# Patient Record
Sex: Female | Born: 1935 | Race: White | Hispanic: No | Smoking: Former smoker
Health system: Southern US, Community
[De-identification: ages and names within clinical notes are randomized; demographics above are authoritative.]

## PROBLEM LIST (undated history)

## (undated) DIAGNOSIS — Z789 Other specified health status: Secondary | ICD-10-CM

## (undated) DIAGNOSIS — I4891 Unspecified atrial fibrillation: Secondary | ICD-10-CM

## (undated) DIAGNOSIS — E782 Mixed hyperlipidemia: Secondary | ICD-10-CM

## (undated) DIAGNOSIS — F0390 Unspecified dementia without behavioral disturbance: Secondary | ICD-10-CM

## (undated) DIAGNOSIS — R32 Unspecified urinary incontinence: Secondary | ICD-10-CM

## (undated) DIAGNOSIS — R1013 Epigastric pain: Secondary | ICD-10-CM

## (undated) DIAGNOSIS — I10 Essential (primary) hypertension: Secondary | ICD-10-CM

## (undated) DIAGNOSIS — E119 Type 2 diabetes mellitus without complications: Secondary | ICD-10-CM

## (undated) DIAGNOSIS — E785 Hyperlipidemia, unspecified: Secondary | ICD-10-CM

## (undated) HISTORY — DX: Hyperlipidemia, unspecified: E78.5

## (undated) HISTORY — DX: Epigastric pain: R10.13

## (undated) HISTORY — DX: Mixed hyperlipidemia: E78.2

## (undated) HISTORY — DX: Other specified health status: Z78.9

## (undated) HISTORY — DX: Essential (primary) hypertension: I10

## (undated) HISTORY — DX: Unspecified atrial fibrillation: I48.91

## (undated) HISTORY — PX: ABDOMINAL HYSTERECTOMY: SHX81

## (undated) HISTORY — PX: CHOLECYSTECTOMY: SHX55

## (undated) HISTORY — DX: Unspecified dementia, unspecified severity, without behavioral disturbance, psychotic disturbance, mood disturbance, and anxiety: F03.90

## (undated) HISTORY — DX: Unspecified urinary incontinence: R32

## (undated) HISTORY — DX: Type 2 diabetes mellitus without complications: E11.9

---

## 1978-09-15 HISTORY — PX: OTHER SURGICAL HISTORY: SHX169

## 2005-09-15 HISTORY — PX: BACK SURGERY: SHX140

## 2010-10-21 DIAGNOSIS — M549 Dorsalgia, unspecified: Secondary | ICD-10-CM

## 2010-10-21 DIAGNOSIS — K635 Polyp of colon: Secondary | ICD-10-CM | POA: Insufficient documentation

## 2010-10-21 DIAGNOSIS — Z9049 Acquired absence of other specified parts of digestive tract: Secondary | ICD-10-CM

## 2010-10-21 DIAGNOSIS — I119 Hypertensive heart disease without heart failure: Secondary | ICD-10-CM | POA: Insufficient documentation

## 2010-10-21 DIAGNOSIS — G8929 Other chronic pain: Secondary | ICD-10-CM

## 2010-10-21 DIAGNOSIS — E782 Mixed hyperlipidemia: Secondary | ICD-10-CM | POA: Insufficient documentation

## 2010-10-21 DIAGNOSIS — J309 Allergic rhinitis, unspecified: Secondary | ICD-10-CM

## 2010-10-21 DIAGNOSIS — I10 Essential (primary) hypertension: Secondary | ICD-10-CM | POA: Insufficient documentation

## 2010-10-21 HISTORY — DX: Other chronic pain: G89.29

## 2010-10-21 HISTORY — DX: Dorsalgia, unspecified: M54.9

## 2010-10-21 HISTORY — DX: Allergic rhinitis, unspecified: J30.9

## 2010-10-21 HISTORY — DX: Acquired absence of other specified parts of digestive tract: Z90.49

## 2013-05-20 DIAGNOSIS — I440 Atrioventricular block, first degree: Secondary | ICD-10-CM | POA: Insufficient documentation

## 2013-12-30 DIAGNOSIS — R195 Other fecal abnormalities: Secondary | ICD-10-CM | POA: Insufficient documentation

## 2014-06-12 DIAGNOSIS — H269 Unspecified cataract: Secondary | ICD-10-CM | POA: Insufficient documentation

## 2014-06-26 HISTORY — PX: CATARACT EXTRACTION: SUR2

## 2014-12-22 DIAGNOSIS — M19041 Primary osteoarthritis, right hand: Secondary | ICD-10-CM | POA: Insufficient documentation

## 2015-11-11 DIAGNOSIS — R079 Chest pain, unspecified: Secondary | ICD-10-CM | POA: Insufficient documentation

## 2015-11-11 DIAGNOSIS — R002 Palpitations: Secondary | ICD-10-CM | POA: Insufficient documentation

## 2017-04-06 DIAGNOSIS — R7303 Prediabetes: Secondary | ICD-10-CM | POA: Insufficient documentation

## 2017-10-07 DIAGNOSIS — Z0000 Encounter for general adult medical examination without abnormal findings: Secondary | ICD-10-CM | POA: Insufficient documentation

## 2017-11-20 DIAGNOSIS — M810 Age-related osteoporosis without current pathological fracture: Secondary | ICD-10-CM | POA: Insufficient documentation

## 2018-06-01 DIAGNOSIS — F0390 Unspecified dementia without behavioral disturbance: Secondary | ICD-10-CM | POA: Insufficient documentation

## 2018-10-04 LAB — CBC AND DIFFERENTIAL
HCT: 45 (ref 36–46)
Hemoglobin: 14.4 (ref 12.0–16.0)
Platelets: 229 (ref 150–399)
WBC: 8.5

## 2018-10-04 LAB — COMPREHENSIVE METABOLIC PANEL
Albumin: 4.7 (ref 3.5–5.0)
Calcium: 9.5 (ref 8.7–10.7)

## 2018-10-04 LAB — BASIC METABOLIC PANEL
BUN: 17 (ref 4–21)
Creatinine: 0.7 (ref 0.5–1.1)
Glucose: 112
Potassium: 3.7 (ref 3.4–5.3)
Sodium: 145 (ref 137–147)

## 2018-10-04 LAB — LIPID PANEL
Cholesterol: 138 (ref 0–200)
HDL: 40 (ref 35–70)
LDL Cholesterol: 74
Triglycerides: 83 (ref 40–160)

## 2018-10-04 LAB — TSH: TSH: 1.84 (ref 0.41–5.90)

## 2018-10-04 LAB — HEMOGLOBIN A1C: Hemoglobin A1C: 5.9

## 2018-10-11 DIAGNOSIS — M545 Low back pain, unspecified: Secondary | ICD-10-CM | POA: Insufficient documentation

## 2018-10-12 DIAGNOSIS — I4891 Unspecified atrial fibrillation: Secondary | ICD-10-CM | POA: Insufficient documentation

## 2018-10-21 DIAGNOSIS — K635 Polyp of colon: Secondary | ICD-10-CM

## 2018-10-21 HISTORY — DX: Polyp of colon: K63.5

## 2018-10-27 LAB — HM MAMMOGRAPHY: HM Mammogram: NORMAL (ref 0–4)

## 2018-11-09 DIAGNOSIS — R931 Abnormal findings on diagnostic imaging of heart and coronary circulation: Secondary | ICD-10-CM

## 2018-11-09 HISTORY — DX: Abnormal findings on diagnostic imaging of heart and coronary circulation: R93.1

## 2019-04-11 DIAGNOSIS — R32 Unspecified urinary incontinence: Secondary | ICD-10-CM | POA: Insufficient documentation

## 2019-04-11 DIAGNOSIS — R252 Cramp and spasm: Secondary | ICD-10-CM | POA: Insufficient documentation

## 2019-11-03 ENCOUNTER — Ambulatory Visit: Payer: Self-pay | Admitting: Internal Medicine

## 2019-11-04 ENCOUNTER — Encounter: Payer: Self-pay | Admitting: Internal Medicine

## 2019-12-05 ENCOUNTER — Ambulatory Visit: Payer: Self-pay | Admitting: Internal Medicine

## 2019-12-08 ENCOUNTER — Ambulatory Visit: Payer: Self-pay | Admitting: Internal Medicine

## 2019-12-09 DIAGNOSIS — I609 Nontraumatic subarachnoid hemorrhage, unspecified: Secondary | ICD-10-CM | POA: Insufficient documentation

## 2019-12-13 MED ORDER — GENERIC EXTERNAL MEDICATION
Status: DC
Start: ? — End: 2019-12-13

## 2019-12-13 MED ORDER — SALINE BACTERIOSTATIC 0.9 % IJ SOLN
3.00 | INTRAMUSCULAR | Status: DC
Start: ? — End: 2019-12-13

## 2019-12-13 MED ORDER — METHOCARBAMOL 750 MG PO TABS
750.00 | ORAL_TABLET | ORAL | Status: DC
Start: 2019-12-13 — End: 2019-12-13

## 2019-12-13 MED ORDER — COENZYME Q10 10 MG PO CAPS
10.00 | ORAL_CAPSULE | ORAL | Status: DC
Start: 2019-12-13 — End: 2019-12-13

## 2019-12-13 MED ORDER — TRAMADOL HCL 50 MG PO TABS
50.00 | ORAL_TABLET | ORAL | Status: DC
Start: ? — End: 2019-12-13

## 2019-12-13 MED ORDER — TOLTERODINE TARTRATE 2 MG PO TABS
2.00 | ORAL_TABLET | ORAL | Status: DC
Start: 2019-12-13 — End: 2019-12-13

## 2019-12-13 MED ORDER — VALSARTAN 80 MG PO TABS
80.00 | ORAL_TABLET | ORAL | Status: DC
Start: 2019-12-13 — End: 2019-12-13

## 2019-12-13 MED ORDER — QUETIAPINE FUMARATE 25 MG PO TABS
25.00 | ORAL_TABLET | ORAL | Status: DC
Start: 2019-12-13 — End: 2019-12-13

## 2019-12-13 MED ORDER — LEVETIRACETAM 500 MG PO TABS
500.00 | ORAL_TABLET | ORAL | Status: DC
Start: 2019-12-13 — End: 2019-12-13

## 2019-12-13 MED ORDER — MEMANTINE HCL 5 MG PO TABS
5.00 | ORAL_TABLET | ORAL | Status: DC
Start: 2019-12-13 — End: 2019-12-13

## 2019-12-13 MED ORDER — CYANOCOBALAMIN 1000 MCG PO TABS
1000.00 | ORAL_TABLET | ORAL | Status: DC
Start: 2019-12-13 — End: 2019-12-13

## 2019-12-13 MED ORDER — ENOXAPARIN SODIUM 30 MG/0.3ML ~~LOC~~ SOLN
30.00 | SUBCUTANEOUS | Status: DC
Start: 2019-12-13 — End: 2019-12-13

## 2019-12-13 MED ORDER — SODIUM CHLORIDE 0.9 % IV SOLN
42.00 | INTRAVENOUS | Status: DC
Start: ? — End: 2019-12-13

## 2019-12-13 MED ORDER — ATORVASTATIN CALCIUM 80 MG PO TABS
80.00 | ORAL_TABLET | ORAL | Status: DC
Start: 2019-12-13 — End: 2019-12-13

## 2019-12-13 MED ORDER — ACETAMINOPHEN 325 MG PO TABS
650.00 | ORAL_TABLET | ORAL | Status: DC
Start: 2019-12-12 — End: 2019-12-13

## 2019-12-13 MED ORDER — DONEPEZIL HCL 5 MG PO TABS
10.00 | ORAL_TABLET | ORAL | Status: DC
Start: 2019-12-13 — End: 2019-12-13

## 2019-12-22 ENCOUNTER — Encounter (INDEPENDENT_AMBULATORY_CARE_PROVIDER_SITE_OTHER): Payer: Self-pay

## 2019-12-22 ENCOUNTER — Encounter: Payer: Self-pay | Admitting: Internal Medicine

## 2019-12-22 ENCOUNTER — Ambulatory Visit (INDEPENDENT_AMBULATORY_CARE_PROVIDER_SITE_OTHER): Payer: Medicare Other | Admitting: Internal Medicine

## 2019-12-22 ENCOUNTER — Other Ambulatory Visit: Payer: Self-pay

## 2019-12-22 VITALS — BP 132/86 | HR 117 | Temp 97.8°F | Ht 63.0 in | Wt 183.6 lb

## 2019-12-22 DIAGNOSIS — W19XXXA Unspecified fall, initial encounter: Secondary | ICD-10-CM | POA: Insufficient documentation

## 2019-12-22 DIAGNOSIS — G301 Alzheimer's disease with late onset: Secondary | ICD-10-CM | POA: Diagnosis not present

## 2019-12-22 DIAGNOSIS — I609 Nontraumatic subarachnoid hemorrhage, unspecified: Secondary | ICD-10-CM | POA: Diagnosis not present

## 2019-12-22 DIAGNOSIS — M810 Age-related osteoporosis without current pathological fracture: Secondary | ICD-10-CM

## 2019-12-22 DIAGNOSIS — Z7189 Other specified counseling: Secondary | ICD-10-CM

## 2019-12-22 DIAGNOSIS — F02818 Dementia in other diseases classified elsewhere, unspecified severity, with other behavioral disturbance: Secondary | ICD-10-CM

## 2019-12-22 DIAGNOSIS — F0281 Dementia in other diseases classified elsewhere with behavioral disturbance: Secondary | ICD-10-CM

## 2019-12-22 DIAGNOSIS — R3981 Functional urinary incontinence: Secondary | ICD-10-CM

## 2019-12-22 DIAGNOSIS — I4821 Permanent atrial fibrillation: Secondary | ICD-10-CM

## 2019-12-22 DIAGNOSIS — M1712 Unilateral primary osteoarthritis, left knee: Secondary | ICD-10-CM

## 2019-12-22 MED ORDER — APIXABAN 5 MG PO TABS: 5.0000 mg | ORAL_TABLET | Freq: Two times a day (BID) | ORAL | 3 refills | Status: DC

## 2019-12-22 MED ORDER — DONEPEZIL HCL 10 MG PO TABS: 10.0000 mg | ORAL_TABLET | Freq: Every evening | ORAL | 1 refills | Status: DC

## 2019-12-22 NOTE — Patient Instructions (Addendum)
Make sure pulse is staying less than 100.  Check twice a day.   Call me if it's staying high.    We'll check your urine for infection.  We will also set you up for prolia here.

## 2019-12-22 NOTE — Progress Notes (Signed)
Provider:  Gwenith Spitz. Renato Gails, D.O., C.M.D. Location:   [facility]   Place of Service:   clinic  Previous PCP: Stacey Balo, DO Patient Care Team: Stacey Balo, DO as PCP - General (Geriatric Medicine)  Extended Emergency Contact Information Primary Emergency Contact: Stacey Dalton Mobile Phone: [phone] Relation: Daughter  Code Status: not yet in place, was discussed some today and family is working on advance care planning discussions Goals of Care: Advanced Directive information Advanced Directives 12/22/2019  Does Patient Have a Medical Advance Directive? No  Does patient want to make changes to medical advance directive? No - Patient declined  Would patient like information on creating a medical advance directive? Yes (ED - Information included in AVS)   Chief Complaint  Patient presents with  . Establish Care    New patient to establish care     HPI: Patient is a 85 y.o. female with h/o dementia, afib, DMII, hyperlipidemia, htn, incontinence, melanoma on her left side of her face, spinal stenosis s/p surgery, and cataracts s/p surgery seen today to establish with [facility].  Records have been requested from Dr. Burt Knack at [facility] in [place], [place].  Here since end of Dec with her daughter, Stacey Dalton.  They visited her son for his bday in [place].  She lived in [place] prior.  Her husband passed away in 08-20-2023.  Son in [place] took care of her until she came here.    In [place], their son had helped as caregiver for them both (dad physically and mom for dementia).  He (son) had melanoma and passed away in 07-20-23.  At that time, she was needing help to make sure she showered and to get meals.  Her daughter stays with her and prompts her with showering and dressing.  She had trouble turning off water.  Uses the handheld.  She's been generally healthy prior to this. She may see people outside --looks for them continuously.    She's had some  paranoia.   She takes seroquel at night.  Some of the paranoia does happen in the daytime.    They have a caregiver who was three days per week and next week will be 5 days per week.  Her daughter is returning to work at the bank.  They were referred by [facility].  She does announce she wants to leave the house and trying to.  No need yet for locks per Stacey Dalton.  She helps with cooking some but not with hot stuff.  She sleeps well at night.  Wakes up at 7am now and then more anxious later in the day.  They've been encouraging a nap.  Used to sleep later to 8:30-9.    Fo her afib, she'd been on eliquis prior to fall 3/26.    She sustained a right frontal subarachnoid hemorrhage on 3/26 after ground level fall while with son in [place].  Also had left periorbital hematoma.   She is now back in [place] with her daughter.    She gets up to urinate sometimes early am and goes back to bed.  Soaks the bed instead much of the time.  Takes detrol bid.  Goes lots.  Depends gets changed 5-6x per day.  No dysuria.  Occasionally hurts over lower abdomen.  2/22, was having frequency and urgency.  Went to [place] and had urine tested which was negative.    C/o knee pain.  Left one hurts her.  Has arthritis.  Did have a little rash on her bottom after her hospitalization but that's better.  She' on B12 supplement--unclear if level was low.    Does take calcium with D.   She has gotten prolia injections for her osteoporosis.  Last was late Nov.  We need to get this approved here locally for her. 10/07/17 was last bone density. She fell once 2 years ago with an unlevel sidewalk with [employer] representative by her church.  Skinned her knee.   Does not use assistive device.    She had prior melanoma on her left face that was removed surgically, but had not metastasized.    We discussed mammograms and they agree with stopping screening for breast cancer at her advanced age b/c she would not tolerate treatments  at this point.   Had both covid vaccines.   Past Medical History:  Diagnosis Date  . A-fib (HCC)   . Allergic rhinitis 10/21/2010  . Back pain 10/21/2010  . Chronic back pain 10/21/2010  . Colonic polyp 10/21/2018  . Dementia (HCC)   . Diabetes mellitus without complication (HCC)   . Echocardiogram abnormal 11/09/2018  . Epigastric abdominal pain   . History of recent travel   . Hyperlipidemia   . Hypertension   . Incontinence   . Mixed hyperlipidemia   . S/P cholecystectomy 10/21/2010   Past Surgical History:  Procedure Laterality Date  . ABDOMINAL HYSTERECTOMY     partial due to prolapse  . BACK SURGERY  2007  . CATARACT EXTRACTION Left 06/26/2014  . CHOLECYSTECTOMY    . melanoma resection Right 1980    Social History   Socioeconomic History  . Marital status: Widowed    Spouse name: Not on file  . Number of children: 5  . Years of education: Not on file  . Highest education level: Not on file  Occupational History  . Not on file  Tobacco Use  . Smoking status: Former Games developer  . Smokeless tobacco: Never Used  Substance and Sexual Activity  . Alcohol use: Yes    Alcohol/week: 2.0 - 3.0 standard drinks    Types: 2 - 3 Glasses of wine per week  . Drug use: Never  . Sexual activity: Not Currently  Other Topics Concern  . Not on file  Social History Narrative   Diet: Standard -Minimize   Sugar (Carbs)        Do you drink/ eat things with caffeine?  Yes Caffeinated Hot Tea      Marital status:     Widow                          What year were you married ? 1958      Do you live in a house, apartment,assistred living, condo, trailer, etc.)?  House      Is it one or more stories? 1 Story [facility] many persons live in your home ?  2      Do you have any pets in your home ?(please list)   No      Highest Level of education completed:  Junior College       Current or past profession: Homemaker      Do you exercise?     No                          Type & how often  ADVANCED DIRECTIVES (Please bring copies)      Do you have a living will? No      Do you have a DNR form?   No                    If not, do you want to discuss one?       Do you have signed POA?HPOA forms?   No              If so, please bring to your appointment      FUNCTIONAL STATUS- To be completed by Spouse / child / Staff  (Child)      Do you have difficulty bathing or dressing yourself ?  Yes      Do you have difficulty preparing food or eating ?  Yes      Do you have difficulty managing your mediation ?  Yes      Do you have difficulty managing your finances ?  Yes      Do you have difficulty affording your medication ?  No      Social Determinants of Health   Financial Resource Strain:   . Difficulty of Paying Living Expenses:   Food Insecurity:   . Worried About Programme researcher, broadcasting/film/video in the Last Year:   . Barista in the Last Year:   Transportation Needs:   . Freight forwarder (Medical):   Marland Kitchen Lack of Transportation (Non-Medical):   Physical Activity:   . Days of Exercise per Week:   . Minutes of Exercise per Session:   Stress:   . Feeling of Stress :   Social Connections:   . Frequency of Communication with Friends and Family:   . Frequency of Social Gatherings with Friends and Family:   . Attends Religious Services:   . Active Member of Clubs or Organizations:   . Attends Banker Meetings:   Marland Kitchen Marital Status:     reports that she has quit smoking. She has never used smokeless tobacco. She reports current alcohol use of about 2.0 - 3.0 standard drinks of alcohol per week. She reports that she does not use drugs.  Functional Status Survey:  see above hpi  Family History  Problem Relation Age of Onset  . Cancer Mother   . Dementia Father        Natural Death  . Hodgkin's lymphoma Son   . Melanoma Son        Seizures    Health Maintenance  Topic Date Due  . DEXA SCAN  12/21/2020 (Originally  06/15/2002)  . INFLUENZA VACCINE  04/15/2020  . TETANUS/TDAP  12/08/2029  . PNA vac Low Risk Adult  Completed    Allergies  Allergen Reactions  . Chamomile Flowers [Chamomile]   . Lisinopril   . Mixed Ragweed   . Other     Seasonal allergies, cats    Outpatient Encounter Medications as of 12/22/2019  Medication Sig  . Calcium Citrate-Vitamin D 315-250 MG-UNIT TABS Take 2 capsules by mouth daily.  . Coenzyme Q10 10 MG capsule Take 100 mg by mouth daily.  . cyanocobalamin 1000 MCG tablet Take 1,000 mcg by mouth every evening.  . donepezil (ARICEPT) 10 MG tablet Take 1 tablet (10 mg total) by mouth every evening.  Marland Kitchen losartan (COZAAR) 50 MG tablet Take 50 mg by mouth daily.  . memantine (NAMENDA) 10 MG tablet Take 5 mg by mouth 2 (two) times daily.  Marland Kitchen  QUEtiapine (SEROQUEL) 25 MG tablet Take 25 mg by mouth every evening.  . rosuvastatin (CRESTOR) 20 MG tablet Take 20 mg by mouth daily.  . [DISCONTINUED] donepezil (ARICEPT) 10 MG tablet Take 10 mg by mouth every evening.  . [DISCONTINUED] tolterodine (DETROL) 2 MG tablet Take 2 mg by mouth 2 (two) times daily.  Marland Kitchen apixaban (ELIQUIS) 5 MG TABS tablet Take 1 tablet (5 mg total) by mouth 2 (two) times daily.  . [DISCONTINUED] apixaban (ELIQUIS) 5 MG TABS tablet Take 5 mg by mouth 2 (two) times daily.   No facility-administered encounter medications on file as of 12/22/2019.    Review of Systems  Constitutional: Positive for malaise/fatigue. Negative for chills and fever.       Weight gain  HENT:       Constant drainage  Eyes: Negative for blurred vision.  Respiratory: Negative for cough and shortness of breath.   Cardiovascular: Negative for chest pain, palpitations and leg swelling.       Afib  Gastrointestinal: Positive for abdominal pain. Negative for blood in stool, constipation, diarrhea, heartburn, melena, nausea and vomiting.  Genitourinary:       Urinary incontinence, frequency, urgency, sometimes lower abdominal pain    Musculoskeletal: Positive for falls and joint pain.       Osteoporosis on prolia   Skin:       Dry skin, thickened toenails--sees podiatry  Endo/Heme/Allergies: Positive for environmental allergies. Bruises/bleeds easily.       Seasonal; prediabetes  Psychiatric/Behavioral: Positive for memory loss. Negative for depression. The patient is not nervous/anxious and does not have insomnia.        Paranoia and some visual hallucinations of people outside    Vitals:   12/22/19 1331  BP: 132/86  Pulse: (!) 117  Temp: 97.8 F (36.6 C)  TempSrc: Temporal  SpO2: 97%  Weight: 183 lb 9.6 oz (83.3 kg)  Height: 5\' 3"  (1.6 m)   Body mass index is 32.52 kg/m. Physical Exam  Labs reviewed: Basic Metabolic Panel: No results for input(s): NA, K, CL, CO2, GLUCOSE, BUN, CREATININE, CALCIUM, MG, PHOS in the last 8760 hours. Liver Function Tests: No results for input(s): AST, ALT, ALKPHOS, BILITOT, PROT, ALBUMIN in the last 8760 hours. No results for input(s): LIPASE, AMYLASE in the last 8760 hours. No results for input(s): AMMONIA in the last 8760 hours. CBC: No results for input(s): WBC, NEUTROABS, HGB, HCT, MCV, PLT in the last 8760 hours. Cardiac Enzymes: No results for input(s): CKTOTAL, CKMB, CKMBINDEX, TROPONINI in the last 8760 hours. BNP: Invalid input(s): POCBNP Lab Results  Component Value Date   HGBA1C 5.9 10/04/2018   Lab Results  Component Value Date   TSH 1.84 10/04/2018   No results found for: VITAMINB12 No results found for: FOLATE No results found for: IRON, TIBC, FERRITIN    Assessment/Plan 1. SAH (subarachnoid hemorrhage) ([place]) -with recent fall -appears she has had no long-term effects from this -it was shrinking on repeat imaging -discussed risk vs benefit re: NOAC resumption for afib (CHADS2vasc and HASBLED) and determined that stroke risk outweighed bleeding risk when she typically does not have falls at baseline  2. Late onset Alzheimer's disease with  behavioral disturbance (Standard) - will assess MMSE next visit in 6 wks -cont support from her daughter and caregivers at home -not to be home alone - donepezil (ARICEPT) 10 MG tablet; Take 1 tablet (10 mg total) by mouth every evening.  Dispense: 90 tablet; Refill: 1 -takes seroquel for her paranoia (?  Degree of benefit vs risk of stroke events and mortality per black box warning--will review more at next visit)  3. Permanent atrial fibrillation (HCC) - rate control did improve to normal range by end of visit (suspect anxiety in new location) - we opted (her daughter and myself) to use NOAC for anticoagulation and stroke prevention - apixaban (ELIQUIS) 5 MG TABS tablet; Take 1 tablet (5 mg total) by mouth 2 (two) times daily.  Dispense: 180 tablet; Refill: 3 -has normal creatinine and bmi is 32 so only dose reduction factor is age in her case  4. Functional urinary incontinence -suspect is cause of her symptoms; however, this is her first visit here so we will r/o infection due to some lower abdominal discomfort also - Urinalysis, Routine w reflex microscopic - Urine Culture  5. Age-related osteoporosis without current pathological fracture -will ask referral coordinator to get prolia approved here since pt was taking in [place] (see hpi) and had prior bone density there  6. Localized osteoarthritis of left knee -has been ongoing and did have some increase in discomfort with her fall -discussed tylenol, topicals as safest remedies, potentially injections if these are not effective  7. ACP (advance care planning) -encouraged completion of updated living will, HCPOA documents and discussed purpose of DNR and MOST orders, as well -pt and daughter to discuss more at home and we will readdress -16 mins spent initiating discussion  Labs/tests ordered:   Lab Orders     Urine Culture     MICROSCOPIC MESSAGE     Urinalysis, Routine w reflex microscopic  02/06/2020 6 wk f/u for MMSE,  ACP  Jane Broughton L. Linn Clavin, D.O. Geriatrics [facility] [address] [place], [place] [address] Cell Phone (Mon-Fri 8am-5pm):  [phone] On Call:  [phone] & follow prompts after 5pm & weekends Office Phone:  [phone] Office Fax:  [phone]

## 2019-12-23 LAB — URINALYSIS, ROUTINE W REFLEX MICROSCOPIC
Bilirubin Urine: NEGATIVE
Glucose, UA: NEGATIVE
Hgb urine dipstick: NEGATIVE
Hyaline Cast: NONE SEEN /LPF
Ketones, ur: NEGATIVE
Nitrite: NEGATIVE
Protein, ur: NEGATIVE
Specific Gravity, Urine: 1.021 (ref 1.001–1.03)
pH: 6.5 (ref 5.0–8.0)

## 2019-12-23 LAB — URINE CULTURE
MICRO NUMBER:: 10343404
SPECIMEN QUALITY:: ADEQUATE

## 2019-12-26 NOTE — Progress Notes (Signed)
Urine sample showed multiple organisms suggesting colonization or that sample was not clean.  I recommend pushing fluids and monitoring.

## 2020-01-18 ENCOUNTER — Encounter: Payer: Self-pay | Admitting: Internal Medicine

## 2020-01-23 ENCOUNTER — Ambulatory Visit (INDEPENDENT_AMBULATORY_CARE_PROVIDER_SITE_OTHER): Payer: Medicare Other | Admitting: Family

## 2020-01-23 ENCOUNTER — Other Ambulatory Visit: Payer: Self-pay

## 2020-01-23 ENCOUNTER — Encounter: Payer: Self-pay | Admitting: Family

## 2020-01-23 VITALS — BP 132/88 | HR 88 | Temp 96.9°F | Resp 18 | Ht 63.0 in | Wt 185.4 lb

## 2020-01-23 DIAGNOSIS — J302 Other seasonal allergic rhinitis: Secondary | ICD-10-CM

## 2020-01-23 DIAGNOSIS — R410 Disorientation, unspecified: Secondary | ICD-10-CM

## 2020-01-23 LAB — POCT URINALYSIS DIPSTICK
Bilirubin, UA: NEGATIVE
Blood, UA: NEGATIVE
Glucose, UA: NEGATIVE
Ketones, UA: NEGATIVE
Nitrite, UA: NEGATIVE
Protein, UA: NEGATIVE
Spec Grav, UA: 1.01 (ref 1.010–1.025)
Urobilinogen, UA: 0.2 E.U./dL
pH, UA: 7 (ref 5.0–8.0)

## 2020-01-23 MED ORDER — LORATADINE 10 MG PO TABS: 10.0000 mg | ORAL_TABLET | Freq: Every day | ORAL | 11 refills | Status: DC

## 2020-01-23 NOTE — Patient Instructions (Signed)
-   Take Loratadine 10 mg tablet one by mouth daily for allergies - Urine specimen collected positive for white blood cells will send for culture and sensitivity will call you with results in 3 days. - Notify provider if symptoms worsen or running any fever > 100.5

## 2020-01-23 NOTE — Progress Notes (Signed)
Provider: Jamyron Redd FNP-C  [name], DO  Patient Care Team: [name], DO as PCP - General (Geriatric Medicine)  Extended Emergency Contact Information Primary Emergency Contact: Audery Amel Mobile Phone: [phone] Relation: Daughter  Code Status: Full Code  Goals of care: Advanced Directive information Advanced Directives 12/22/2019  Does Patient Have a Medical Advance Directive? No  Does patient want to make changes to medical advance directive? No - Patient declined  Would patient like information on creating a medical advance directive? Yes (ED - Information included in AVS)     Chief Complaint  Patient presents with  . Acute Visit    Daughter accompanies patient, is concerned about increased confusion and also increased belching and abdominal pain.     HPI:  Pt is a 85 y.o. female seen today for an acute visit for evaluation of increased confusion x 1 week.She is here with her daughter.she states patient has a care giver.Reports patient tries to dress up despite having clothes on.Tries to get out of the house.she has a significant medical history of dementia which could be contributing to her increased confusion.Care giver encourages her to take a nap during the day. Also states has had belching and abdominal pain.she states abdominal pain is relieved with bowel movement.Appetite is not good but drinks apple juice,and supplement that family gets from [employer]. No issues with constipation.Also denies any hurt burn or chest pain.  Daughter also would  like something for seasonal allergies that will not interact with patient's current medication.she has had runny nose.No cough, fever or chills reported.    Past Medical History:  Diagnosis Date  . A-fib (HCC)   . Allergic rhinitis 10/21/2010  . Back pain 10/21/2010  . Chronic back pain 10/21/2010  . Colonic polyp 10/21/2018  . Dementia (HCC)   . Diabetes mellitus without complication (HCC)   .  Echocardiogram abnormal 11/09/2018  . Epigastric abdominal pain   . History of recent travel   . Hyperlipidemia   . Hypertension   . Incontinence   . Mixed hyperlipidemia   . S/P cholecystectomy 10/21/2010   Past Surgical History:  Procedure Laterality Date  . ABDOMINAL HYSTERECTOMY     partial due to prolapse  . BACK SURGERY  2007  . CATARACT EXTRACTION Left 06/26/2014  . CHOLECYSTECTOMY    . melanoma resection Right 1980    Allergies  Allergen Reactions  . Chamomile Flowers [Chamomile]   . Lisinopril   . Mixed Ragweed   . Other     Seasonal allergies, cats    Outpatient Encounter Medications as of 01/23/2020  Medication Sig  . apixaban (ELIQUIS) 5 MG TABS tablet Take 1 tablet (5 mg total) by mouth 2 (two) times daily.  . Calcium Citrate-Vitamin D 315-250 MG-UNIT TABS Take 2 capsules by mouth daily.  . Coenzyme Q10 100 MG TABS Take 1 tablet by mouth daily.  . cyanocobalamin 1000 MCG tablet Take 1,000 mcg by mouth every evening.  . donepezil (ARICEPT) 10 MG tablet Take 1 tablet (10 mg total) by mouth every evening.  Marland Kitchen losartan (COZAAR) 50 MG tablet Take 50 mg by mouth daily.  . memantine (NAMENDA) 10 MG tablet Take 5 mg by mouth 2 (two) times daily.  . QUEtiapine (SEROQUEL) 25 MG tablet Take 25 mg by mouth every evening.  . rosuvastatin (CRESTOR) 20 MG tablet Take 20 mg by mouth daily.  . [DISCONTINUED] Coenzyme Q10 10 MG capsule Take 100 mg by mouth daily.   No facility-administered encounter  medications on file as of 01/23/2020.    Review of Systems  Constitutional: Negative for chills, fatigue and fever.  HENT: Positive for rhinorrhea. Negative for congestion, postnasal drip, sinus pressure, sinus pain, sneezing, sore throat and trouble swallowing.   Eyes: Negative for discharge, redness and itching.  Respiratory: Negative for cough, chest tightness, shortness of breath and wheezing.   Cardiovascular: Negative for chest pain, palpitations and leg swelling.    Gastrointestinal: Negative for abdominal distention, constipation, diarrhea, nausea and vomiting.       Abd pain per HPI   Genitourinary: Negative for difficulty urinating, dysuria, flank pain, frequency and urgency.  Musculoskeletal: Positive for gait problem.  Skin: Negative for color change, pallor and rash.  Psychiatric/Behavioral: Positive for confusion. Negative for agitation, behavioral problems and sleep disturbance. The patient is not nervous/anxious.     Immunization History  Administered Date(s) Administered  . Influenza, High Dose Seasonal PF 06/28/2012, 08/01/2013, 06/12/2014, 07/23/2015, 07/08/2016, 06/15/2017, 07/12/2019  . Influenza,inj,Quad PF,6+ Mos 06/01/2018  . Influenza-Unspecified 07/04/2009, 06/26/2011  . Moderna SARS-COVID-2 Vaccination 10/04/2019, 11/03/2019  . Pneumococcal Conjugate-13 06/12/2014  . Pneumococcal Polysaccharide-23 08/15/2000, 09/15/2008  . Td 09/15/2004  . Tdap 12/09/2019  . Zoster 06/16/2011, 07/31/2011   Pertinent  Health Maintenance Due  Topic Date Due  . DEXA SCAN  12/21/2020 (Originally 06/15/2002)  . INFLUENZA VACCINE  04/15/2020  . PNA vac Low Risk Adult  Completed   Fall Risk  01/23/2020 12/22/2019  Falls in the past year? 1 1  Comment December 09, 2019, missed step on curb. -  Number falls in past yr: 0 1  Injury with Fall? (No Data) 1  Comment Facial bruising. Head, brain bleed, side of face and    Vitals:   01/23/20 1352  BP: 132/88  Pulse: 88  Resp: 18  Temp: (!) 96.9 F (36.1 C)  TempSrc: Temporal  SpO2: 95%  Weight: 185 lb 6.4 oz (84.1 kg)  Height: 5\' 3"  (1.6 m)   Body mass index is 32.84 kg/m. Physical Exam Vitals reviewed.  Constitutional:      General: She is not in acute distress.    Appearance: She is obese. She is not ill-appearing.  HENT:     Head: Normocephalic.     Right Ear: Tympanic membrane, ear canal and external ear normal. There is no impacted cerumen.     Left Ear: Tympanic membrane, ear canal  and external ear normal. There is no impacted cerumen.     Nose: Nose normal. No congestion or rhinorrhea.     Mouth/Throat:     Mouth: Mucous membranes are moist.     Pharynx: Oropharynx is clear. No oropharyngeal exudate or posterior oropharyngeal erythema.  Eyes:     General: No scleral icterus.       Right eye: No discharge.        Left eye: No discharge.     Conjunctiva/sclera: Conjunctivae normal.     Pupils: Pupils are equal, round, and reactive to light.  Musculoskeletal:     Cervical back: Normal range of motion.  Neurological:     Mental Status: She is alert.    Labs reviewed:  Lab Results  Component Value Date   TSH 1.84 10/04/2018   Lab Results  Component Value Date   HGBA1C 5.9 10/04/2018   Lab Results  Component Value Date   CHOL 138 10/04/2018   HDL 40 10/04/2018   LDLCALC 74 10/04/2018   TRIG 83 10/04/2018    Significant Diagnostic Results in last  30 days:  No results found.  Assessment/Plan 1. Confusion Suspect symptoms could be associated with dementia verse acute infections.  Afebrile.Negative exam findings. - POC Urinalysis Dipstick shows light yellow clear yellow,negative for blood,protein and Nitrites but positive for leukocytes  Moderate 2+ results discussed with patient's daughter and patient will send urine for culture and sensitivity.Then will call you with results in 3 days verbalized understanding.  - Advised to increase water intake.  - Advised to notify provider if symptoms worsen or running any fever > 100.5   - Urinne Culture   2. Seasonal allergies Reports runny nose.No cough. - Increase water intake to 6-8 glasses daily  - will start on Loratadine 10 mg tablet daily  - loratadine (CLARITIN) 10 MG tablet; Take 1 tablet (10 mg total) by mouth daily.  Dispense: 30 tablet; Refill: 11  Family/ staff Communication: Reviewed plan of care with patient and daughter.   Labs/tests ordered: - Urinne Culture   Next Appointment: As needed  if symptoms worsen or fail to improve.    Sandrea Hughs, NP

## 2020-01-25 LAB — URINE CULTURE
MICRO NUMBER:: 10462959
SPECIMEN QUALITY:: ADEQUATE

## 2020-01-30 ENCOUNTER — Telehealth: Payer: Self-pay | Admitting: Internal Medicine

## 2020-01-30 ENCOUNTER — Telehealth: Payer: Self-pay | Admitting: *Deleted

## 2020-01-30 NOTE — Telephone Encounter (Signed)
Please check to make sure the Claritin purchased does not have Claritin- D which causes drowsiness.Need to get one without the " D".she can take Claritin at bedtime.

## 2020-01-30 NOTE — Telephone Encounter (Signed)
Daughter, [name] called and stated that patient was advised to take Claritin for runny nose. Daughter stated that she read the warnings and it stated it Tylon Kemmerling cause drowsiness and daughter is wondering with patient's history of falls if it would be ok for her to take this medication at bedtime instead of the morning. Please Advise.

## 2020-01-30 NOTE — Telephone Encounter (Signed)
Patient daughter notified and agreed.  

## 2020-01-30 NOTE — Telephone Encounter (Signed)
Patient daughter called. Telephone message created. Disregard this message.

## 2020-01-30 NOTE — Telephone Encounter (Signed)
Daughter has ? Regarding medication that was ordered for mom.  Please call to advise.

## 2020-02-01 ENCOUNTER — Telehealth: Payer: Self-pay

## 2020-02-01 NOTE — Telephone Encounter (Signed)
Sounds like this may have been a dementia behavior rather than a new medical concern since she returned to baseline so quickly and vitals and sugar were all good.  I just recommend continuing to monitor her at this point.

## 2020-02-01 NOTE — Telephone Encounter (Signed)
Called daughter back and let her know. She suspected it was dementia and will discuss it in more detail at patient's appointment on Monday.

## 2020-02-01 NOTE — Telephone Encounter (Signed)
Patient's daughter, [name], called and had some concerns about a change in her mother's status. [facility] suggested she call her PCP.  This a.m. patient refused medications and tried throwing them at [facility]. She also refused to eat. She went into the kitchen and lay her head down on the table and refused to lift her head.  WS did a wellness visit and called EMS. On EMS arrival patient was fine, all vitals were normal, and they got her to take her medications. I asked if they took her BS and it was 116. I asked if any concerns with sx of UTI and they were denied. Patient at this point (about 4 hours post event) is back to baseline.   Patient already has an appointment scheduled with you on Monday, 5/24, which daughter is aware of.

## 2020-02-06 ENCOUNTER — Other Ambulatory Visit: Payer: Self-pay

## 2020-02-06 ENCOUNTER — Encounter: Payer: Self-pay | Admitting: Internal Medicine

## 2020-02-06 ENCOUNTER — Ambulatory Visit (INDEPENDENT_AMBULATORY_CARE_PROVIDER_SITE_OTHER): Payer: Medicare Other | Admitting: Internal Medicine

## 2020-02-06 VITALS — BP 126/82 | HR 80 | Temp 97.9°F | Ht 63.0 in | Wt 185.2 lb

## 2020-02-06 DIAGNOSIS — F0281 Dementia in other diseases classified elsewhere with behavioral disturbance: Secondary | ICD-10-CM | POA: Diagnosis not present

## 2020-02-06 DIAGNOSIS — N3281 Overactive bladder: Secondary | ICD-10-CM | POA: Insufficient documentation

## 2020-02-06 DIAGNOSIS — I609 Nontraumatic subarachnoid hemorrhage, unspecified: Secondary | ICD-10-CM | POA: Diagnosis not present

## 2020-02-06 DIAGNOSIS — M810 Age-related osteoporosis without current pathological fracture: Secondary | ICD-10-CM

## 2020-02-06 DIAGNOSIS — F02818 Dementia in other diseases classified elsewhere, unspecified severity, with other behavioral disturbance: Secondary | ICD-10-CM

## 2020-02-06 DIAGNOSIS — G301 Alzheimer's disease with late onset: Secondary | ICD-10-CM | POA: Diagnosis not present

## 2020-02-06 LAB — COMPLETE METABOLIC PANEL WITH GFR
AG Ratio: 1.9 (calc) (ref 1.0–2.5)
ALT: 25 U/L (ref 6–29)
AST: 21 U/L (ref 10–35)
Albumin: 4.3 g/dL (ref 3.6–5.1)
Alkaline phosphatase (APISO): 57 U/L (ref 37–153)
BUN: 14 mg/dL (ref 7–25)
CO2: 31 mmol/L (ref 20–32)
Calcium: 9.7 mg/dL (ref 8.6–10.4)
Chloride: 105 mmol/L (ref 98–110)
Creat: 0.77 mg/dL (ref 0.60–0.88)
GFR, Est African American: 83 mL/min/{1.73_m2} (ref >=60)
GFR, Est Non African American: 72 mL/min/{1.73_m2} (ref >=60)
Globulin: 2.3 g/dL (calc) (ref 1.9–3.7)
Glucose, Bld: 82 mg/dL (ref 65–139)
Potassium: 4.6 mmol/L (ref 3.5–5.3)
Sodium: 142 mmol/L (ref 135–146)
Total Bilirubin: 0.5 mg/dL (ref 0.2–1.2)
Total Protein: 6.6 g/dL (ref 6.1–8.1)

## 2020-02-06 LAB — POCT URINALYSIS DIPSTICK (MANUAL)
Leukocytes, UA: NEGATIVE
Nitrite, UA: NEGATIVE
Poct Bilirubin: NEGATIVE
Poct Blood: NEGATIVE
Poct Glucose: NORMAL mg/dL
Poct Ketones: NEGATIVE
Poct Protein: NEGATIVE mg/dL
Poct Urobilinogen: NORMAL mg/dL
Spec Grav, UA: 1.01 (ref 1.010–1.025)
pH, UA: 6 (ref 5.0–8.0)

## 2020-02-06 LAB — CBC WITH DIFFERENTIAL/PLATELET
Absolute Monocytes: 770 cells/uL (ref 200–950)
Basophils Absolute: 40 cells/uL (ref 0–200)
Basophils Relative: 0.4 %
Eosinophils Absolute: 100 cells/uL (ref 15–500)
Eosinophils Relative: 1 %
HCT: 43.5 % (ref 35.0–45.0)
Hemoglobin: 14.6 g/dL (ref 11.7–15.5)
Lymphs Abs: 2180 cells/uL (ref 850–3900)
MCH: 30.4 pg (ref 27.0–33.0)
MCHC: 33.6 g/dL (ref 32.0–36.0)
MCV: 90.6 fL (ref 80.0–100.0)
MPV: 10.7 fL (ref 7.5–12.5)
Monocytes Relative: 7.7 %
Neutro Abs: 6910 cells/uL (ref 1500–7800)
Neutrophils Relative %: 69.1 %
Platelets: 218 10*3/uL (ref 140–400)
RBC: 4.8 10*6/uL (ref 3.80–5.10)
RDW: 12.2 % (ref 11.0–15.0)
Total Lymphocyte: 21.8 %
WBC: 10 10*3/uL (ref 3.8–10.8)

## 2020-02-06 LAB — VITAMIN B12: Vitamin B-12: >2000 pg/mL — ABNORMAL HIGH (ref 200–1100)

## 2020-02-06 MED ORDER — DENOSUMAB 60 MG/ML ~~LOC~~ SOSY: 60.0000 mg | PREFILLED_SYRINGE | Freq: Once | SUBCUTANEOUS | 0 refills | Status: DC

## 2020-02-06 MED ORDER — DENOSUMAB 60 MG/ML ~~LOC~~ SOSY
60.0000 mg | PREFILLED_SYRINGE | Freq: Once | SUBCUTANEOUS | Status: AC
  Administered 2020-02-06: 60 mg via SUBCUTANEOUS

## 2020-02-06 MED ORDER — MIRABEGRON ER 25 MG PO TB24: 25.0000 mg | ORAL_TABLET | Freq: Every day | ORAL | 3 refills | Status: DC

## 2020-02-06 NOTE — Progress Notes (Signed)
Location:  [facility] clinic Provider:  Lashunda Greis L. Renato Gails, D.O., C.M.D.  Code Status: DNR Goals of Care:  Advanced Directives 02/06/2020  Does Patient Have a Medical Advance Directive? Yes  Type of Advance Directive Healthcare Power of Attorney  Does patient want to make changes to medical advance directive? No - Patient declined  Would patient like information on creating a medical advance directive? -   Chief Complaint  Patient presents with  . Medical Management of Chronic Issues    6 week memory test / prolia injection   . Medication Management    would like to discuss night-time med and how she is taking them     HPI: Patient is a 85 y.o. female seen today for medical management of chronic diseases.   She is here for MMSE and prolia injection for her osteoporosis.    She had an episode of minimally responsiveness.   She refused her meds, didn't take them, didn't sleep then.   Her daughter is concerned about her med combo at hs.  She's had increased incontinence--had been getting up and urinating overnight, but is not now.  Her daughter doesn't want to wake her up and take her to the bathroom.  She may sometimes wake up, take off depends.  She tries to put on other clothes then.  Sometimes puts wet depends in closet.    Has just a little rhinitis chronically.  Blows nose during meal.    She wants to leave, someone is picking her up, thinks people that are there to do things are taking her somewhere.  She'll get very agitated about it.  She's locked herself in the bathroom and that's had to be changed around.    Appetite is really good.  They went to [facility], a chicken coop and went to a winery with the family (she had water), went to a friend's cookout and a trip to [employer].    Past Medical History:  Diagnosis Date  . A-fib (HCC)   . Allergic rhinitis 10/21/2010  . Back pain 10/21/2010  . Chronic back pain 10/21/2010  . Colonic polyp 10/21/2018  . Dementia (HCC)   . Diabetes  mellitus without complication (HCC)   . Echocardiogram abnormal 11/09/2018  . Epigastric abdominal pain   . History of recent travel   . Hyperlipidemia   . Hypertension   . Incontinence   . Mixed hyperlipidemia   . S/P cholecystectomy 10/21/2010    Past Surgical History:  Procedure Laterality Date  . ABDOMINAL HYSTERECTOMY     partial due to prolapse  . BACK SURGERY  2007  . CATARACT EXTRACTION Left 06/26/2014  . CHOLECYSTECTOMY    . melanoma resection Right 1980    Allergies  Allergen Reactions  . Chamomile Flowers [Chamomile]   . Lisinopril   . Mixed Ragweed   . Other     Seasonal allergies, cats    Outpatient Encounter Medications as of 02/06/2020  Medication Sig  . apixaban (ELIQUIS) 5 MG TABS tablet Take 1 tablet (5 mg total) by mouth 2 (two) times daily.  . Calcium Citrate-Vitamin D 315-250 MG-UNIT TABS Take 2 capsules by mouth daily.  . Coenzyme Q10 100 MG TABS Take 1 tablet by mouth daily.  . cyanocobalamin 1000 MCG tablet Take 1,000 mcg by mouth every evening.  . donepezil (ARICEPT) 10 MG tablet Take 1 tablet (10 mg total) by mouth every evening.  . loratadine (CLARITIN) 10 MG tablet Take 1 tablet (10 mg total) by mouth daily.  Marland Kitchen  losartan (COZAAR) 50 MG tablet Take 50 mg by mouth daily.  . memantine (NAMENDA) 10 MG tablet Take 5 mg by mouth 2 (two) times daily.  . QUEtiapine (SEROQUEL) 25 MG tablet Take 25 mg by mouth every evening.  . rosuvastatin (CRESTOR) 20 MG tablet Take 20 mg by mouth daily.   No facility-administered encounter medications on file as of 02/06/2020.    Review of Systems:  Review of Systems  Constitutional: Positive for malaise/fatigue. Negative for chills and fever.  HENT: Negative for congestion and sore throat.   Eyes:       For eye exam coming up  Respiratory: Negative for cough and shortness of breath.   Cardiovascular: Negative for chest pain, palpitations and leg swelling.       Afib  Gastrointestinal: Negative for abdominal  pain.  Genitourinary: Positive for frequency and urgency. Negative for dysuria and hematuria.       Incontinence especially overnight into morning  Musculoskeletal: Positive for falls. Negative for joint pain.       Left forearm tenderness  Skin: Negative for itching and rash.  Neurological: Positive for weakness. Negative for dizziness and loss of consciousness.  Endo/Heme/Allergies: Bruises/bleeds easily.  Psychiatric/Behavioral: Positive for memory loss. Negative for depression. The patient is not nervous/anxious and does not have insomnia.     Health Maintenance  Topic Date Due  . DEXA SCAN  12/21/2020 (Originally 06/15/2002)  . INFLUENZA VACCINE  04/15/2020  . TETANUS/TDAP  12/08/2029  . COVID-19 Vaccine  Completed  . PNA vac Low Risk Adult  Completed    Physical Exam: Vitals:   02/06/20 1532  BP: 126/82  Pulse: 80  Temp: 97.9 F (36.6 C)  TempSrc: Temporal  SpO2: 97%  Weight: 185 lb 3.2 oz (84 kg)  Height: 5\' 3"  (1.6 m)   Body mass index is 32.81 kg/m. Physical Exam Vitals reviewed.  Constitutional:      General: She is not in acute distress.    Appearance: She is not toxic-appearing.  HENT:     Head: Normocephalic and atraumatic.  Cardiovascular:     Rate and Rhythm: Rhythm irregular.     Heart sounds: No murmur.  Pulmonary:     Effort: Pulmonary effort is normal.     Breath sounds: Normal breath sounds. No rales.  Abdominal:     General: Bowel sounds are normal. There is no distension.     Palpations: Abdomen is soft.     Tenderness: There is no abdominal tenderness.  Neurological:     Mental Status: She is alert.     Motor: Weakness present.  Psychiatric:        Mood and Affect: Mood normal.     Comments: Pleasant with me, but was agitated when attempted MMSE which she got zero on orientation and registration and then did not want to continue     Labs reviewed: Basic Metabolic Panel: No results for input(s): NA, K, CL, CO2, GLUCOSE, BUN,  CREATININE, CALCIUM, MG, PHOS, TSH in the last 8760 hours. Liver Function Tests: No results for input(s): AST, ALT, ALKPHOS, BILITOT, PROT, ALBUMIN in the last 8760 hours. No results for input(s): LIPASE, AMYLASE in the last 8760 hours. No results for input(s): AMMONIA in the last 8760 hours. CBC: No results for input(s): WBC, NEUTROABS, HGB, HCT, MCV, PLT in the last 8760 hours. Lipid Panel: No results for input(s): CHOL, HDL, LDLCALC, TRIG, CHOLHDL, LDLDIRECT in the last 8760 hours. Lab Results  Component Value Date   HGBA1C  5.9 10/04/2018    Procedures since last visit: No results found.  Assessment/Plan 1. Late onset Alzheimer's disease with behavioral disturbance (HCC) - appears she is progressing with more behaviors and periods of sleepiness - avoid antihistamine - seroquel may be playing a role in this but she gets too agitated and upset per family without the med  -they are considering memory care admission vs memory center during the day -cont home care at this time and family support - COMPLETE METABOLIC PANEL WITH GFR - CBC with Differential/Platelet - Vitamin B12 -urine dip was totally negative today--she has overactive bladder vs functional incontinence with her dementia at this point so we should not be checking this unless she has fever, hematuria, abdominal pain, overt legitimate symptoms going forward  2. SAH (subarachnoid hemorrhage) (HCC) - with fall end of last year - COMPLETE METABOLIC PANEL WITH GFR - CBC with Differential/Platelet  3. Overactive bladder - will try her on myrbetriq--others are not options due to anticholinergic effects which will counteract her aricept and namenda - mirabegron ER (MYRBETRIQ) 25 MG TB24 tablet; Take 1 tablet (25 mg total) by mouth daily.  Dispense: 90 tablet; Refill: 3 - POCT Urinalysis Dip Manual  4. Senile osteoporosis - cont ca with D - denosumab (PROLIA) injection 60 mg given today, repeat in 6 mos  Labs/tests  ordered:   Lab Orders     COMPLETE METABOLIC PANEL WITH GFR     CBC with Differential/Platelet     Vitamin B12     POCT Urinalysis Dip Manual  Next appt:  06/07/2020  Shanessa Hodak L. Takiesha Mcdevitt, D.O. Geriatrics [facility] [address], [place] [address] Cell Phone (Mon-Fri 8am-5pm):  [phone] On Call:  [phone] & follow prompts after 5pm & weekends Office Phone:  [phone] Office Fax:  [phone]

## 2020-02-07 NOTE — Progress Notes (Signed)
Blood counts, electrolytes, liver and kidneys were all ok.  She's getting plenty of B12.  No changes needed based on these.

## 2020-02-15 ENCOUNTER — Telehealth: Payer: Self-pay | Admitting: *Deleted

## 2020-02-15 DIAGNOSIS — F0281 Dementia in other diseases classified elsewhere with behavioral disturbance: Secondary | ICD-10-CM

## 2020-02-15 DIAGNOSIS — F02818 Dementia in other diseases classified elsewhere, unspecified severity, with other behavioral disturbance: Secondary | ICD-10-CM

## 2020-02-15 DIAGNOSIS — G301 Alzheimer's disease with late onset: Secondary | ICD-10-CM

## 2020-02-15 MED ORDER — QUETIAPINE FUMARATE 25 MG PO TABS: 25.0000 mg | ORAL_TABLET | Freq: Two times a day (BID) | ORAL | 1 refills | Status: DC

## 2020-02-15 NOTE — Telephone Encounter (Signed)
For the time being, let's increase the seroquel to 25mg  in the morning and at night.  It is reasonable to schedule an appt with Dr. , as well.

## 2020-02-15 NOTE — Telephone Encounter (Signed)
LMOM to return call.

## 2020-02-15 NOTE — Telephone Encounter (Signed)
[name] notified and agreed.

## 2020-02-15 NOTE — Telephone Encounter (Signed)
Patient daughter, [name] called and stated that patient is not listening and having angry issues. Stated that she is kicking and hitting. [facility] stated that they Marieclaire Bettenhausen have to Stop their services due to this. They suggested a referral to Dr. Archer Asa to help with patient's medications and prescribing something for the behavior. Daughter is wanting to know your advise and if you would place a referral for this. Stated that she is actively looking for placement in a facility.  Please Advise.

## 2020-02-24 ENCOUNTER — Telehealth: Payer: Self-pay

## 2020-02-24 NOTE — Telephone Encounter (Signed)
Seroquel is an antipsychotic which is used to treat hallucinations and delusions when they begin to scare a patient or the patient becomes in some way dangerous to themselves or others.  It can sometimes cause sedation.  The black box warning on these medications can be frightening b/c they were shown to increase risk of stroke and death in older adults with dementia.  Sometimes, they become necessary and that risk may be outweighed by being able to safely care for a loved one at home or at a facility.

## 2020-02-24 NOTE — Telephone Encounter (Signed)
1. Double the dose to twice daily in stead of one dose, because she is being combative, confused, doesn't want to listen, for example she was wet, the care giver tried to clean her up and Stacey Dalton pushed the care giver. She is concerned that  the medication is not for dementia patients and it causes a lot of side effects that Stacey Dalton is having. Should she be on this drug? Seroquel 25 mg take 1 tablet 2 times a day. She is getting ready to put her into [facility]. Please Advise? [phone]. The drug was prescribed by Burt Knack her old PCP.  She read the form from [facility] and that has her alarmed (daughter)

## 2020-02-27 ENCOUNTER — Encounter: Payer: Self-pay | Admitting: Internal Medicine

## 2020-02-27 NOTE — Telephone Encounter (Signed)
Results given to daughter.

## 2020-02-28 ENCOUNTER — Other Ambulatory Visit: Payer: Self-pay

## 2020-02-28 ENCOUNTER — Telehealth: Payer: Self-pay

## 2020-02-28 DIAGNOSIS — Z111 Encounter for screening for respiratory tuberculosis: Secondary | ICD-10-CM

## 2020-02-28 NOTE — Telephone Encounter (Signed)
Message left on clinical intake voicemail:   Returning Stacey Dalton's call (Dr.Reed's assistance) to schedule TB skin test and chest xray.  I returned call to patients daughter and scheduled appt for TB skin test placement and reading. I informed patients daughter that they do not usually require both the skin test and a chest xray. Per [name] she will look into that and clarify prior to getting chest xray.  Chest xray order placed and will be canceled if not needed. [name] is aware she can walk in for xray at [facility].   Results to be faxed to: [name] [phone]

## 2020-03-02 ENCOUNTER — Other Ambulatory Visit: Payer: Self-pay

## 2020-03-02 ENCOUNTER — Ambulatory Visit (INDEPENDENT_AMBULATORY_CARE_PROVIDER_SITE_OTHER): Payer: Medicare Other

## 2020-03-02 DIAGNOSIS — Z111 Encounter for screening for respiratory tuberculosis: Secondary | ICD-10-CM

## 2020-03-02 NOTE — Telephone Encounter (Signed)
This encounter was created in error - please disregard.

## 2020-03-05 ENCOUNTER — Ambulatory Visit: Payer: Medicare Other | Admitting: *Deleted

## 2020-03-05 ENCOUNTER — Other Ambulatory Visit: Payer: Self-pay

## 2020-03-05 LAB — TB SKIN TEST
Induration: 0 mm
TB Skin Test: NEGATIVE

## 2020-03-05 NOTE — Progress Notes (Signed)
TB test negative.  This probably needed to be printed and sent to the facility where she's going with the FL2 if not already done.

## 2020-03-12 ENCOUNTER — Encounter: Payer: Self-pay | Admitting: Internal Medicine

## 2020-03-20 ENCOUNTER — Telehealth: Payer: Self-pay

## 2020-03-20 NOTE — Telephone Encounter (Signed)
1.) Patients daughter called requesting for Dr.Reed to sign a DNR on her mother. [name] plans to pick-up on. DNR completed and placed in Dr.Reed's review and sign folder.  Call [name] back when ready   2.) Christy with [facility] called (phone number [phone]) requesting a copy of TB skin test results for they have not received and this is required for patient to move.   Results faxed to [phone] as requested

## 2020-03-22 ENCOUNTER — Other Ambulatory Visit: Payer: Self-pay

## 2020-03-22 MED ORDER — QUETIAPINE FUMARATE 25 MG PO TABS: 25.0000 mg | ORAL_TABLET | Freq: Two times a day (BID) | ORAL | 1 refills | Status: DC

## 2020-03-22 NOTE — Telephone Encounter (Signed)
Form completed and given to CMA to call her daughter for pickup.

## 2020-03-22 NOTE — Telephone Encounter (Signed)
Stacey Dalton with [facility] called requesting copy of medication list for FL2 was missing the dose for seroquel. Stacey Dalton also asked for script to be sent to [facility].  All request fulfilled. Medication list was faxed to [phone]. RX was sent electronically.

## 2020-03-23 NOTE — Telephone Encounter (Signed)
Called daugher to pick up paper per Dr. Renato Gails

## 2020-04-05 ENCOUNTER — Other Ambulatory Visit: Payer: Self-pay | Admitting: Internal Medicine

## 2020-04-05 DIAGNOSIS — F419 Anxiety disorder, unspecified: Secondary | ICD-10-CM

## 2020-04-05 MED ORDER — LORAZEPAM 0.5 MG PO TABS: 0.5000 mg | ORAL_TABLET | Freq: Every day | ORAL | 0 refills | Status: DC | PRN

## 2020-04-05 NOTE — Progress Notes (Signed)
Received fax from [facility] from [employer] the memory care coordinator.  She notes that Ms. Can's dementia has made it hard for her to adjust to the new place and she's been agitated--has been shoving and pushing other residents.  They are requesting a regulated calming medicine for anxiety and aggression.  Rx for Ativan 0.5mg  po daily prn anxiety sent to [facility] and order written on fax.

## 2020-05-17 ENCOUNTER — Other Ambulatory Visit: Payer: Self-pay

## 2020-05-17 DIAGNOSIS — F419 Anxiety disorder, unspecified: Secondary | ICD-10-CM

## 2020-05-17 MED ORDER — LORAZEPAM 0.5 MG PO TABS: 0.5000 mg | ORAL_TABLET | Freq: Every day | ORAL | 0 refills | Status: DC | PRN

## 2020-05-17 NOTE — Telephone Encounter (Signed)
Incoming fax received from [facility] requesting refill on Lorazepam.  RX last filled on 04/05/20 in Epic  No treatment agreement on file, notation made on pending appointment for 06/07/2020

## 2020-06-07 ENCOUNTER — Encounter: Payer: Self-pay | Admitting: Nurse Practitioner

## 2020-06-07 ENCOUNTER — Other Ambulatory Visit: Payer: Self-pay

## 2020-06-07 ENCOUNTER — Ambulatory Visit (INDEPENDENT_AMBULATORY_CARE_PROVIDER_SITE_OTHER): Payer: Medicare Other | Admitting: Nurse Practitioner

## 2020-06-07 VITALS — BP 110/90 | HR 90 | Temp 97.5°F | Ht 63.0 in | Wt 188.4 lb

## 2020-06-07 DIAGNOSIS — I4821 Permanent atrial fibrillation: Secondary | ICD-10-CM | POA: Diagnosis not present

## 2020-06-07 DIAGNOSIS — E782 Mixed hyperlipidemia: Secondary | ICD-10-CM

## 2020-06-07 DIAGNOSIS — I10 Essential (primary) hypertension: Secondary | ICD-10-CM

## 2020-06-07 DIAGNOSIS — F419 Anxiety disorder, unspecified: Secondary | ICD-10-CM | POA: Diagnosis not present

## 2020-06-07 DIAGNOSIS — N3281 Overactive bladder: Secondary | ICD-10-CM

## 2020-06-07 DIAGNOSIS — F0281 Dementia in other diseases classified elsewhere with behavioral disturbance: Secondary | ICD-10-CM

## 2020-06-07 DIAGNOSIS — G301 Alzheimer's disease with late onset: Secondary | ICD-10-CM | POA: Diagnosis not present

## 2020-06-07 DIAGNOSIS — M810 Age-related osteoporosis without current pathological fracture: Secondary | ICD-10-CM

## 2020-06-07 DIAGNOSIS — F02818 Dementia in other diseases classified elsewhere, unspecified severity, with other behavioral disturbance: Secondary | ICD-10-CM

## 2020-06-07 NOTE — Progress Notes (Signed)
Careteam: Patient Care Team: Gayland Curry, DO as PCP - General (Geriatric Medicine)  PLACE OF SERVICE:  [place] Directive information Does Patient Have a Medical Advance Directive?: Yes, Type of Advance Directive: [place];Out of facility DNR (pink MOST or yellow form), Does patient want to make changes to medical advance directive?: No - Patient declined  Allergies  Allergen Reactions  . Chamomile Flowers [Chamomile]   . Lisinopril   . Mixed Ragweed   . Other     Seasonal allergies, cats    Chief Complaint  Patient presents with  . Medical Management of Chronic Issues    4 month follow up. Patient needs to sign new treatment agreement. Patient's daughter Mickel Baas is with her at her appointment today.Patient has had some issues with agitation and wants to discuss medications and changing pharmacies. Daughter states that they want to put patient on medication for agitation, but does not want to.   . Best Practice Recommendations    Flu vaccine     HPI: Patient is a 85 y.o. female for routine follow up.   Moved to memory care at [facility] in July was an adjustment at first but overall adjusting well. Hardest time in the morning. Taking medication with apple sauce because she tends to want to refuse medication in the morning. Continues on Seroquel twice daily.  Continues on aricept and namenda. Gradual decline in memory. Not knowing the family more.   Anxiety- was recently prescribed lorazepam due to increase in anxiety.   Overactive bladder- doing well on myrbetriq, also using a different pad which has been helping. Not as many accidents.   She is participation in PT now that she is there. Had her evaluation today to see how much treatment is indicated. She has not had any falls.  No pains.   Occasionally she will complain of stomach pain, but typically happens around the time of a BM. No constipation noted on this time.   Has bunions,  followed by podiatry to have nails trimmed.   Would like to use [facility].   Hyperlipidemia- continues on crestor, not fasting today  Osteoporosis- getting prolia with cal and vit d  A fib- on eliquis for anticoagulation.   Flu shot clinic at memory care  Review of Systems:  Review of Systems  Unable to perform ROS: Dementia    Past Medical History:  Diagnosis Date  . A-fib ([place])   . Allergic rhinitis 10/21/2010  . Back pain 10/21/2010  . Chronic back pain 10/21/2010  . Colonic polyp 10/21/2018  . Dementia ([place])   . Diabetes mellitus without complication ([place])   . Echocardiogram abnormal 11/09/2018  . Epigastric abdominal pain   . History of recent travel   . Hyperlipidemia   . Hypertension   . Incontinence   . Mixed hyperlipidemia   . S/P cholecystectomy 10/21/2010   Past Surgical History:  Procedure Laterality Date  . ABDOMINAL HYSTERECTOMY     partial due to prolapse  . BACK SURGERY  2007  . CATARACT EXTRACTION Left 06/26/2014  . CHOLECYSTECTOMY    . melanoma resection Right 1980   Social History:   reports that she has quit smoking. She has never used smokeless tobacco. She reports previous alcohol use of about 2.0 - 3.0 standard drinks of alcohol per week. She reports that she does not use drugs.  Family History  Problem Relation Age of Onset  . Cancer Mother   . Dementia Father  Natural Death  . Hodgkin's lymphoma Son   . Melanoma Son        Seizures    Medications: Patient's Medications  New Prescriptions   No medications on file  Previous Medications   APIXABAN (ELIQUIS) 5 MG TABS TABLET    Take 1 tablet (5 mg total) by mouth 2 (two) times daily.   CALCIUM CITRATE-VITAMIN D 315-250 MG-UNIT TABS    Take 2 capsules by mouth daily.   COENZYME Q10 100 MG TABS    Take 1 tablet by mouth daily.   CYANOCOBALAMIN 1000 MCG TABLET    Take 1,000 mcg by mouth every evening.   DONEPEZIL (ARICEPT) 10 MG TABLET    Take 1 tablet (10 mg total) by  mouth every evening.   LORAZEPAM (ATIVAN) 0.5 MG TABLET    Take 1 tablet (0.5 mg total) by mouth daily as needed for anxiety.   LOSARTAN (COZAAR) 50 MG TABLET    Take 50 mg by mouth daily.   MEMANTINE (NAMENDA) 5 MG TABLET    Take 5 mg by mouth 2 (two) times daily.    MIRABEGRON ER (MYRBETRIQ) 25 MG TB24 TABLET    Take 1 tablet (25 mg total) by mouth daily.   NYSTATIN CREAM (MYCOSTATIN)    Apply 1 application topically 4 (four) times daily.   QUETIAPINE (SEROQUEL) 25 MG TABLET    Take 1 tablet (25 mg total) by mouth 2 (two) times daily.   ROSUVASTATIN (CRESTOR) 20 MG TABLET    Take 20 mg by mouth daily.  Modified Medications   No medications on file  Discontinued Medications   No medications on file    Physical Exam:  Vitals:   06/07/20 1512  BP: 110/90  Pulse: (!) 103  Temp: (!) 97.5 F (36.4 C)  SpO2: 96%  Weight: 188 lb 6.4 oz (85.5 kg)  Height: 5' 3"  (1.6 m)   Body mass index is 33.37 kg/m. Wt Readings from Last 3 Encounters:  06/07/20 188 lb 6.4 oz (85.5 kg)  02/06/20 185 lb 3.2 oz (84 kg)  01/23/20 185 lb 6.4 oz (84.1 kg)    Physical Exam Constitutional:      General: She is not in acute distress.    Appearance: She is well-developed. She is not diaphoretic.  HENT:     Head: Normocephalic and atraumatic.     Mouth/Throat:     Pharynx: No oropharyngeal exudate.  Eyes:     Conjunctiva/sclera: Conjunctivae normal.     Pupils: Pupils are equal, round, and reactive to light.  Cardiovascular:     Rate and Rhythm: Normal rate and regular rhythm.     Heart sounds: Normal heart sounds.  Pulmonary:     Effort: Pulmonary effort is normal.     Breath sounds: Normal breath sounds.  Abdominal:     General: Bowel sounds are normal.     Palpations: Abdomen is soft.  Musculoskeletal:        General: No tenderness.     Cervical back: Normal range of motion and neck supple.  Skin:    General: Skin is warm and dry.  Neurological:     Mental Status: She is alert. Mental  status is at baseline.  Psychiatric:     Comments: Quiet, relies on daughter to provide answers for questions.     Labs reviewed: Basic Metabolic Panel: Recent Labs    02/06/20 1632  NA 142  K 4.6  CL 105  CO2 31  GLUCOSE 82  BUN 14  CREATININE 0.77  CALCIUM 9.7   Liver Function Tests: Recent Labs    02/06/20 1632  AST 21  ALT 25  BILITOT 0.5  PROT 6.6   No results for input(s): LIPASE, AMYLASE in the last 8760 hours. No results for input(s): AMMONIA in the last 8760 hours. CBC: Recent Labs    02/06/20 1632  WBC 10.0  NEUTROABS 6,910  HGB 14.6  HCT 43.5  MCV 90.6  PLT 218   Lipid Panel: No results for input(s): CHOL, HDL, LDLCALC, TRIG, CHOLHDL, LDLDIRECT in the last 8760 hours. TSH: No results for input(s): TSH in the last 8760 hours. A1C: Lab Results  Component Value Date   HGBA1C 5.9 10/04/2018     Assessment/Plan 1. Permanent atrial fibrillation (HCC) Rate controlled, continues on eiquis for anticoagulation.  - CMP with eGFR([facility]) - CBC with Differential/Platelet  2. Late onset Alzheimer's disease with behavioral disturbance ([place]) -progressive decline. Continues on namenda BID with aricept qhs. Moved into memory care over the summer and overall been doing well with the transition however does have some increase in anxiety.   3. Overactive bladder Controlled on myrbetriq 25 mg daily  4. Senile osteoporosis Continues on prolia, with cal and vit d, prolia appt has been scheduled  5. Anxiety Some anxiety noted at new facility, ativan given PRN which has been uses sparingly, daughter will continue to monitor this and if it becomes needed more often would consider daily medication.   6. Essential hypertension -controlled on lostartan 50 mg daily with dietary modifications.  7. Mixed hyperlipidemia -continues on crestor 20 mg daily.  - Lipid Panel - CMP with eGFR([facility])  Next appt: 4 months with Dr Mariea Clonts. Sooner if needed  [employer].  [place], [facility] [phone]

## 2020-06-08 LAB — CBC WITH DIFFERENTIAL/PLATELET
Absolute Monocytes: 746 cells/uL (ref 200–950)
Basophils Absolute: 27 cells/uL (ref 0–200)
Basophils Relative: 0.3 %
Eosinophils Absolute: 127 cells/uL (ref 15–500)
Eosinophils Relative: 1.4 %
HCT: 40.7 % (ref 35.0–45.0)
Hemoglobin: 13.8 g/dL (ref 11.7–15.5)
Lymphs Abs: 1684 cells/uL (ref 850–3900)
MCH: 30.7 pg (ref 27.0–33.0)
MCHC: 33.9 g/dL (ref 32.0–36.0)
MCV: 90.4 fL (ref 80.0–100.0)
MPV: 10.8 fL (ref 7.5–12.5)
Monocytes Relative: 8.2 %
Neutro Abs: 6516 cells/uL (ref 1500–7800)
Neutrophils Relative %: 71.6 %
Platelets: 215 10*3/uL (ref 140–400)
RBC: 4.5 10*6/uL (ref 3.80–5.10)
RDW: 12.6 % (ref 11.0–15.0)
Total Lymphocyte: 18.5 %
WBC: 9.1 10*3/uL (ref 3.8–10.8)

## 2020-06-08 LAB — COMPLETE METABOLIC PANEL WITH GFR
AG Ratio: 1.9 (calc) (ref 1.0–2.5)
ALT: 15 U/L (ref 6–29)
AST: 16 U/L (ref 10–35)
Albumin: 4 g/dL (ref 3.6–5.1)
Alkaline phosphatase (APISO): 61 U/L (ref 37–153)
BUN/Creatinine Ratio: 17 (calc) (ref 6–22)
BUN: 16 mg/dL (ref 7–25)
CO2: 25 mmol/L (ref 20–32)
Calcium: 9.7 mg/dL (ref 8.6–10.4)
Chloride: 108 mmol/L (ref 98–110)
Creat: 0.94 mg/dL — ABNORMAL HIGH (ref 0.60–0.88)
GFR, Est African American: 65 mL/min/{1.73_m2} (ref >=60)
GFR, Est Non African American: 56 mL/min/{1.73_m2} — ABNORMAL LOW (ref >=60)
Globulin: 2.1 g/dL (calc) (ref 1.9–3.7)
Glucose, Bld: 82 mg/dL (ref 65–139)
Potassium: 4.3 mmol/L (ref 3.5–5.3)
Sodium: 142 mmol/L (ref 135–146)
Total Bilirubin: 0.5 mg/dL (ref 0.2–1.2)
Total Protein: 6.1 g/dL (ref 6.1–8.1)

## 2020-06-08 LAB — LIPID PANEL
Cholesterol: 124 mg/dL (ref <200)
HDL: 38 mg/dL — ABNORMAL LOW (ref >=50)
LDL Cholesterol (Calc): 62 mg/dL (calc)
Non-HDL Cholesterol (Calc): 86 mg/dL (calc) (ref <130)
Total CHOL/HDL Ratio: 3.3 (calc) (ref <5.0)
Triglycerides: 163 mg/dL — ABNORMAL HIGH (ref <150)

## 2020-06-21 ENCOUNTER — Other Ambulatory Visit: Payer: Self-pay | Admitting: *Deleted

## 2020-06-21 DIAGNOSIS — F419 Anxiety disorder, unspecified: Secondary | ICD-10-CM

## 2020-06-21 MED ORDER — LORAZEPAM 0.5 MG PO TABS: 0.5000 mg | ORAL_TABLET | Freq: Every day | ORAL | 0 refills | Status: DC | PRN

## 2020-06-21 NOTE — Telephone Encounter (Signed)
Received refill Request from [facility].  Pended Rx and sent to Dr. Reed for approval.  

## 2020-07-04 ENCOUNTER — Other Ambulatory Visit: Payer: Self-pay | Admitting: Internal Medicine

## 2020-07-04 DIAGNOSIS — F0281 Dementia in other diseases classified elsewhere with behavioral disturbance: Secondary | ICD-10-CM

## 2020-07-04 DIAGNOSIS — G301 Alzheimer's disease with late onset: Secondary | ICD-10-CM

## 2020-07-04 DIAGNOSIS — F02818 Dementia in other diseases classified elsewhere, unspecified severity, with other behavioral disturbance: Secondary | ICD-10-CM

## 2020-07-04 NOTE — Telephone Encounter (Signed)
Patient last seen in office on 9/23 by Abbey Chatters NP last refill for this med was 12/22/2019. rx sent to pharmacy by [facility]

## 2020-07-05 ENCOUNTER — Other Ambulatory Visit: Payer: Self-pay | Admitting: *Deleted

## 2020-07-05 DIAGNOSIS — F419 Anxiety disorder, unspecified: Secondary | ICD-10-CM

## 2020-07-05 MED ORDER — LOSARTAN POTASSIUM 50 MG PO TABS
50.0000 mg | ORAL_TABLET | Freq: Every day | ORAL | 1 refills | Status: DC
Start: 2020-07-05 — End: 2020-08-28

## 2020-07-05 MED ORDER — LORAZEPAM 0.5 MG PO TABS: 0.5000 mg | ORAL_TABLET | Freq: Every day | ORAL | 0 refills | Status: DC | PRN

## 2020-07-05 MED ORDER — MEMANTINE HCL 5 MG PO TABS
5.0000 mg | ORAL_TABLET | Freq: Two times a day (BID) | ORAL | 1 refills | Status: DC
Start: 2020-07-05 — End: 2021-01-01

## 2020-07-05 MED ORDER — ROSUVASTATIN CALCIUM 20 MG PO TABS
20.0000 mg | ORAL_TABLET | Freq: Every day | ORAL | 1 refills | Status: DC
Start: 2020-07-05 — End: 2021-01-01

## 2020-07-05 NOTE — Telephone Encounter (Signed)
Received request from [facility].  Pended Rx's and sent to Dr. Renato Gails for approval.

## 2020-07-26 ENCOUNTER — Telehealth: Payer: Self-pay | Admitting: *Deleted

## 2020-07-26 ENCOUNTER — Other Ambulatory Visit: Payer: Self-pay | Admitting: *Deleted

## 2020-07-26 DIAGNOSIS — F419 Anxiety disorder, unspecified: Secondary | ICD-10-CM

## 2020-07-26 MED ORDER — LORAZEPAM 0.5 MG PO TABS: 0.5000 mg | ORAL_TABLET | Freq: Every day | ORAL | 0 refills | Status: DC | PRN

## 2020-07-26 NOTE — Telephone Encounter (Signed)
Daughter, Leotis Shames called and stated that she cannot find patient's Calcium/vitamin D 315-250  Wants to know if it is ok for patient to take Calcium 1200mg  plus Vitamin D3 instead and how often.   Please Advise.

## 2020-07-26 NOTE — Telephone Encounter (Signed)
The new calcium with D should be once a day.

## 2020-07-26 NOTE — Telephone Encounter (Signed)
Received fax from [facility].  Pended Rx and sent to Dr. Renato Gails for approval.

## 2020-07-26 NOTE — Telephone Encounter (Signed)
Daughter notified and agreed.  

## 2020-08-01 ENCOUNTER — Other Ambulatory Visit: Payer: Self-pay | Admitting: Internal Medicine

## 2020-08-01 DIAGNOSIS — G8929 Other chronic pain: Secondary | ICD-10-CM

## 2020-08-01 DIAGNOSIS — M545 Low back pain, unspecified: Secondary | ICD-10-CM

## 2020-08-01 MED ORDER — ACETAMINOPHEN 500 MG PO TABS: 1000.0000 mg | ORAL_TABLET | Freq: Two times a day (BID) | ORAL | 0 refills | Status: DC

## 2020-08-06 ENCOUNTER — Telehealth: Payer: Self-pay

## 2020-08-06 NOTE — Telephone Encounter (Signed)
[facility] called and is requesting an order to change calcium from 600 to 1200 mg qd. The daughter was provided this information but the facility requires an order. They are also requesting an order for a UA.  Fax [phone]

## 2020-08-06 NOTE — Telephone Encounter (Signed)
Ok to increase calcium to 1200mg  daily.  It should have vitamin D in with the calcium supplement like caltrate 600mg  with D3 400 units po bid. As far as the UA c+s, I thought I'd written on one of the many faxes over the past week to push fluids and if after 24 hrs, she was not improved to obtains UA c+s--ok to do now.

## 2020-08-07 NOTE — Telephone Encounter (Signed)
Order faxed to the provided fax #.

## 2020-08-13 ENCOUNTER — Other Ambulatory Visit: Payer: Self-pay

## 2020-08-13 ENCOUNTER — Ambulatory Visit (INDEPENDENT_AMBULATORY_CARE_PROVIDER_SITE_OTHER): Payer: Medicare Other

## 2020-08-13 DIAGNOSIS — R3981 Functional urinary incontinence: Secondary | ICD-10-CM

## 2020-08-13 DIAGNOSIS — N3281 Overactive bladder: Secondary | ICD-10-CM

## 2020-08-13 DIAGNOSIS — M810 Age-related osteoporosis without current pathological fracture: Secondary | ICD-10-CM

## 2020-08-13 LAB — POCT URINALYSIS DIPSTICK
Bilirubin, UA: NEGATIVE
Glucose, UA: NEGATIVE
Protein, UA: POSITIVE — AB
Spec Grav, UA: 1.025 (ref 1.010–1.025)
Urobilinogen, UA: 0.2 E.U./dL
pH, UA: 5 (ref 5.0–8.0)

## 2020-08-13 MED ORDER — DENOSUMAB 60 MG/ML ~~LOC~~ SOSY
60.0000 mg | PREFILLED_SYRINGE | Freq: Once | SUBCUTANEOUS | Status: AC
  Administered 2020-08-13: 60 mg via SUBCUTANEOUS

## 2020-08-14 ENCOUNTER — Telehealth: Payer: Self-pay

## 2020-08-14 NOTE — Telephone Encounter (Signed)
Incoming call received from patients daughter stating she seen the urinalysis results on mychart and would like an explanation about positive protein.  Please advise

## 2020-08-14 NOTE — Telephone Encounter (Signed)
I would not dwell on the positive protein in the urine dipstick sample.  I'm waiting on the culture to return because the UA suggests infection.  Stacey Dalton should be encouraged to drink plenty of water.

## 2020-08-14 NOTE — Telephone Encounter (Signed)
Discussed response with patients daughter and she verbalized understanding  °

## 2020-08-15 LAB — URINE CULTURE
MICRO NUMBER:: 11255275
SPECIMEN QUALITY:: ADEQUATE

## 2020-08-16 ENCOUNTER — Telehealth: Payer: Self-pay | Admitting: *Deleted

## 2020-08-16 MED ORDER — CEPHALEXIN 500 MG PO CAPS
500.0000 mg | ORAL_CAPSULE | Freq: Two times a day (BID) | ORAL | 0 refills | Status: DC
Start: 2020-08-16 — End: 2020-08-28

## 2020-08-16 MED ORDER — SACCHAROMYCES BOULARDII 250 MG PO CAPS
250.0000 mg | ORAL_CAPSULE | Freq: Two times a day (BID) | ORAL | 0 refills | Status: DC
Start: 2020-08-16 — End: 2020-08-28

## 2020-08-16 NOTE — Telephone Encounter (Signed)
-----   Message from [name], DO sent at 08/15/2020  5:50 PM EST ----- Begin keflex 500mg  po bid for 7 days for klebsiella urinary tract infection.  Please send in Rx to pharmacy of choice for her facility and fax order to the facility (print this out and I will sign it manually first thing tomorrow).

## 2020-08-16 NOTE — Telephone Encounter (Signed)
Yes, let's add florastor 250mg  po bid x 7 days also as probiotic.     Samyuktha Brau L. Nealie Mchatton, D.O. Geriatrics [facility] [address], [place] On Call:  [phone] & follow prompts after 5pm & weekends Office Phone:  [phone] Office Fax:  [phone]

## 2020-08-16 NOTE — Telephone Encounter (Signed)
error 

## 2020-08-16 NOTE — Telephone Encounter (Signed)
Medication list updated and Note faxed to [facility].

## 2020-08-16 NOTE — Progress Notes (Signed)
Recommend florastor 250mg  po bid for 7 days, as well.

## 2020-08-16 NOTE — Telephone Encounter (Signed)
Patient daughter, [name], Notified and agreed. Antibiotic sent to [facility] pharmacy. Daughter also wants you to make note on the signed order to be faxed to [facility] to include taking a Probiotic OTC (daughter will pick it up from the pharmacy.)

## 2020-08-28 ENCOUNTER — Telehealth: Payer: Self-pay

## 2020-08-28 NOTE — Telephone Encounter (Addendum)
I called [facility] at [facility]. I was transfer to [facility] and only able to leave a voicemail at 11:26am. I left voicemail and also faxed a request for updated medication list to both [facility] [phone] and [facility] Fax [phone]. I'm waiting for a reply from all 3 attempts. Once I received a reply I will update medication list and notify you. Patient schedule for office visit 10/01/2020 at 3:15pm. Would you like to reassess patient sooner? Message routed to PCP Reed, Tiffany L, DO .

## 2020-08-28 NOTE — Progress Notes (Signed)
Abstracted medications from fax sent by [facility] at [facility] per Dr.Reed request.

## 2020-08-28 NOTE — Telephone Encounter (Signed)
Great - thanks

## 2020-08-28 NOTE — Telephone Encounter (Signed)
-----   Message from [name], DO sent at 08/28/2020 11:03 AM EST ----- Regarding: need med list Received attached message requesting change in formulation for patient's meds.  Please contact [facility] and obtain an up-to-date med list from them to reconcile with ours so I can determine what can be crushed or changed to liquid and what needs to be discontinued.  If pt has not been seen and does not have scheduled appt, I'd like for that to be arranged at this point to reassess her. Thanks, Tiffany L. Reed, D.O. Geriatrics [facility] [address] [place], [place] [address] Cell Phone (Mon-Fri 8am-5pm):  [phone] On Call:  [phone] & follow prompts after 5pm & weekends Office Phone:  [phone] Office Fax:  [phone]    ----- Message ----- From: Tera Partridge Sent: 08/28/2020  10:05 AM EST To: [name], DO

## 2020-08-28 NOTE — Telephone Encounter (Signed)
Fax received by [facility] at [facility]. Medication list updated by abstraction encounter. Paper placed inside you review and sign folder Reed, Tiffany L, DO .

## 2020-08-30 NOTE — Telephone Encounter (Signed)
I went through the list and wrote orders next to them--plan to d/c calcium with D, coq10 and possibly myrbetriq if not truly helping with incontinence at this point.  Crush all the others that can be and changed tylenol to liquid.

## 2020-08-31 NOTE — Telephone Encounter (Signed)
Per Dr.Reed Calcium with Vitamin D, and COQ10 removed. Myrbetriq kept on list due to provider Bufford Spikes L, DO  stating "possibly" removing medication.

## 2020-08-31 NOTE — Addendum Note (Signed)
Addended by: Meda Klinefelter E on: 08/31/2020 09:08 AM   Modules accepted: Orders

## 2020-09-06 ENCOUNTER — Telehealth: Payer: Self-pay | Admitting: Internal Medicine

## 2020-09-06 NOTE — Telephone Encounter (Signed)
Audery Amel (daughter) called in to request medication list for the patient Stacey Dalton (her mother). Daughter questioned why 2 medications were dropped. I faxed over a copy of medication list to daughters office [phone]).

## 2020-09-06 NOTE — Telephone Encounter (Signed)
Stacey Dalton, daughter called back and stated that she received the updated medication list and stated that they have discontinued the Myrbetriq also since it wasn't helping with patient's symptoms.  Updated the medication list.

## 2020-09-06 NOTE — Addendum Note (Signed)
Addended by: Nelda Severe A on: 09/06/2020 04:40 PM   Modules accepted: Orders

## 2020-09-06 NOTE — Telephone Encounter (Signed)
Spoke with [name], daughter and she stated that [facility] told her that Dr. Renato Gails discontinued patient's Crestor and she was wondering why.  Informed daughter that Crestor was still in patient's current medication list and read her Dr. Ernest Mallick message dated 08/30/2020:  [name], DO to Dicky Doe, [place]     5:32 PM Note I went through the list and wrote orders next to them--plan to d/c calcium with D, coq10 and possibly myrbetriq if not truly helping with incontinence at this point.  Crush all the others that can be and changed tylenol to liquid.       Daughter stated that she is going to call [facility] and let them know and to tell them to call our office or fax with any concerns. Agreed.

## 2020-09-14 ENCOUNTER — Other Ambulatory Visit: Payer: Self-pay | Admitting: Internal Medicine

## 2020-09-17 NOTE — Telephone Encounter (Signed)
rx sent to pharmacy by [facility]  

## 2020-09-20 ENCOUNTER — Other Ambulatory Visit: Payer: Self-pay | Admitting: Internal Medicine

## 2020-09-20 ENCOUNTER — Other Ambulatory Visit: Payer: Self-pay | Admitting: *Deleted

## 2020-09-20 DIAGNOSIS — I4821 Permanent atrial fibrillation: Secondary | ICD-10-CM

## 2020-09-20 DIAGNOSIS — F419 Anxiety disorder, unspecified: Secondary | ICD-10-CM

## 2020-09-20 MED ORDER — APIXABAN 5 MG PO TABS: 5.0000 mg | ORAL_TABLET | Freq: Two times a day (BID) | ORAL | 3 refills | Status: DC

## 2020-09-20 NOTE — Telephone Encounter (Signed)
Patient has requested refill on medication "Lorazepam 0.5 mg". Patient last refill was 07/26/2020. Patient was given 30 tablets to be taken daily as needed with 0 refills. Patient is due for refill. Medication pend and sent to PCP Renato Gails, Tiffany L, DO . Please Advise.

## 2020-09-20 NOTE — Telephone Encounter (Signed)
Stacey Dalton, daughter requested refill to be sent to [facility].

## 2020-09-24 ENCOUNTER — Telehealth: Payer: Self-pay

## 2020-09-24 NOTE — Telephone Encounter (Signed)
I haven't gotten anything from her facility about this.  This type of request should be coming from them.

## 2020-09-24 NOTE — Telephone Encounter (Signed)
Called and left message for [name] to call office to inform her of what Dr. Ernest Mallick response.

## 2020-09-24 NOTE — Telephone Encounter (Signed)
Patient's daughter [name] called stating that order for Tylenol was supposed to be changed to a liquid order and stating that she was being asked to get her more medicine since she has been taking medication twice a day for back pain. [name] stated that she feels that taking this twice a day is excessive. She stated that she ordered 3 bottles off [employer] and was told that they could not use it at the facility to give to her. Maybe order needs to be changed to as needed. The bottles she has are 160mg  bottles and wants to know if prescription can be written so they can use what she has. Please advise.

## 2020-09-25 NOTE — Telephone Encounter (Signed)
Patients daughter called to ask to have her mother's Tylenol changed from gel caps to Liquid I read her the message from Dr. Renato Gails from earlier and told her she would have to have the facility to make the request to Dr. Renato Gails ant from her she had no questions and ask to have he  Mother's appointment time changed as the facility was under lock down for Covid so I rescheduled appointment for two weeks out

## 2020-10-01 ENCOUNTER — Ambulatory Visit: Payer: Medicare Other | Admitting: Internal Medicine

## 2020-10-03 ENCOUNTER — Telehealth: Payer: Self-pay

## 2020-10-03 ENCOUNTER — Telehealth: Payer: Self-pay | Admitting: *Deleted

## 2020-10-03 NOTE — Telephone Encounter (Signed)
Printed and Faxed instructions to [place] with [facility].

## 2020-10-03 NOTE — Telephone Encounter (Signed)
[place] with [facility] called and stated that she just wanted to let you know that patient has tested POSITIVE for Covid.  Stated that the only symptom patient is showing is a runny nose.  Wants to know if there should be any treatment of any kind.  Please Advise.

## 2020-10-03 NOTE — Telephone Encounter (Signed)
I recommend that she isolate, that her oxygen and temperature be monitored each shift while she is awake.  They should monitor her for any new symptoms.  Tylenol may be used for fevers.

## 2020-10-03 NOTE — Telephone Encounter (Signed)
Nurse Rush Barer called the office to inform Dr. Renato Gails DO that she had put an order in for patients O2 levels to be monitored but she said they don't do that at their facility and she was calling to let her know this

## 2020-10-04 NOTE — Telephone Encounter (Signed)
[name], daughter called and wanted to know why you didn't prescribe patient to take Vitamin C, Vitamin D and Zinc. Daughter did state that patient Stacey Dalton have a hard time taking it by mouth, but wanted to know if this was an option for her. Only symptom at this time is runny nose.  Please Advise.

## 2020-10-04 NOTE — Telephone Encounter (Signed)
Her difficulty swallowing was exactly why I did not prescribe all of the vitamins.

## 2020-10-04 NOTE — Telephone Encounter (Signed)
Stacey Dalton, daughter, notified and agreed.

## 2020-10-08 ENCOUNTER — Ambulatory Visit: Payer: Medicare Other | Admitting: Internal Medicine

## 2020-10-14 ENCOUNTER — Encounter: Payer: Self-pay | Admitting: Internal Medicine

## 2020-10-18 ENCOUNTER — Ambulatory Visit (INDEPENDENT_AMBULATORY_CARE_PROVIDER_SITE_OTHER): Payer: Medicare Other | Admitting: Orthopedic Surgery

## 2020-10-18 ENCOUNTER — Other Ambulatory Visit: Payer: Self-pay

## 2020-10-18 ENCOUNTER — Ambulatory Visit: Payer: Medicare Other | Admitting: Internal Medicine

## 2020-10-18 ENCOUNTER — Encounter: Payer: Self-pay | Admitting: Orthopedic Surgery

## 2020-10-18 VITALS — BP 118/78 | HR 79 | Temp 97.3°F | Resp 20 | Ht 63.0 in | Wt 191.6 lb

## 2020-10-18 DIAGNOSIS — N3281 Overactive bladder: Secondary | ICD-10-CM | POA: Diagnosis not present

## 2020-10-18 DIAGNOSIS — E782 Mixed hyperlipidemia: Secondary | ICD-10-CM

## 2020-10-18 DIAGNOSIS — G301 Alzheimer's disease with late onset: Secondary | ICD-10-CM

## 2020-10-18 DIAGNOSIS — M545 Low back pain, unspecified: Secondary | ICD-10-CM

## 2020-10-18 DIAGNOSIS — R635 Abnormal weight gain: Secondary | ICD-10-CM | POA: Diagnosis not present

## 2020-10-18 DIAGNOSIS — R3981 Functional urinary incontinence: Secondary | ICD-10-CM

## 2020-10-18 DIAGNOSIS — G8929 Other chronic pain: Secondary | ICD-10-CM

## 2020-10-18 DIAGNOSIS — F02818 Dementia in other diseases classified elsewhere, unspecified severity, with other behavioral disturbance: Secondary | ICD-10-CM

## 2020-10-18 DIAGNOSIS — F0281 Dementia in other diseases classified elsewhere with behavioral disturbance: Secondary | ICD-10-CM

## 2020-10-18 DIAGNOSIS — M810 Age-related osteoporosis without current pathological fracture: Secondary | ICD-10-CM | POA: Diagnosis not present

## 2020-10-18 DIAGNOSIS — I10 Essential (primary) hypertension: Secondary | ICD-10-CM | POA: Diagnosis not present

## 2020-10-18 DIAGNOSIS — F419 Anxiety disorder, unspecified: Secondary | ICD-10-CM

## 2020-10-18 DIAGNOSIS — I4821 Permanent atrial fibrillation: Secondary | ICD-10-CM

## 2020-10-18 NOTE — Progress Notes (Signed)
Careteam: Patient Care Team: Stacey Balo, DO as PCP - General (Geriatric Medicine)  Seen by: Stacey Dalton, AGNP-C  PLACE OF SERVICE:  [facility]  Advanced Directive information Does Patient Have a Medical Advance Directive?: No  Allergies  Allergen Reactions  . Chamomile Flowers [Chamomile]   . Lisinopril   . Mixed Ragweed   . Other     Seasonal allergies, cats    Chief Complaint  Patient presents with  . Medical Management of Chronic Issues    4 Month Follow Up     HPI: Patient is a 86 y.o. female seen today for medical management of chronic conditions.   Daughter present for encounter.   Still living in the memory care unit at [facility]. Ambulates slowly on her own without assistance of cane or walker. Continues to feed herself. Requiring minimal assistance with ADL's. No recent falls or injuries. No recent behavioral outbursts. Facility reports there is another resident in the memory care unit that she dislikes. Staff believes it is because of a man they both like. In addition, daughter denies any issues with anxiety at this time, but still receiving lorazepam for sleep. They are also giving her tylenol daily to help relieve back pain, when she does get it she is not as calm.   She tested positive for covid  01/19 and placed on isolation precautions. 01/28 she retested negative. In the beginning she had mild symptoms and towards the end she was asymptomatic.   Myrbetriq discontinued, date unknown. Daughter did not think it was effective for the cost. Still having incontinent episodes daily, but sleeping well throughout the night.   She was enrolled in PT a few months back. Daughter states she really enjoyed it. At this time she is waiting to recertify for PT sessions again.   Appetite remains healthy. Daughter concerned about weight gain of > 10 lbs in past month. She remains on regular diet at [facility]. Reports facility  was doing box meals due to staffing  issues for a period of time.   Review of Systems:  Review of Systems  Unable to perform ROS: Dementia    Past Medical History:  Diagnosis Date  . A-fib (HCC)   . Allergic rhinitis 10/21/2010  . Back pain 10/21/2010  . Chronic back pain 10/21/2010  . Colonic polyp 10/21/2018  . Dementia (HCC)   . Diabetes mellitus without complication (HCC)   . Echocardiogram abnormal 11/09/2018  . Epigastric abdominal pain   . History of recent travel   . Hyperlipidemia   . Hypertension   . Incontinence   . Mixed hyperlipidemia   . S/P cholecystectomy 10/21/2010   Past Surgical History:  Procedure Laterality Date  . ABDOMINAL HYSTERECTOMY     partial due to prolapse  . BACK SURGERY  2007  . CATARACT EXTRACTION Left 06/26/2014  . CHOLECYSTECTOMY    . melanoma resection Right 1980   Social History:   reports that she has quit smoking. She has never used smokeless tobacco. She reports previous alcohol use. She reports that she does not use drugs.  Family History  Problem Relation Age of Onset  . Cancer Mother   . Dementia Father        Natural Death  . Hodgkin's lymphoma Son   . Melanoma Son        Seizures    Medications: Patient's Medications  New Prescriptions   No medications on file  Previous Medications   ACETAMINOPHEN (TYLENOL) 500 MG TABLET  Take 2 tablets (1,000 mg total) by mouth in the morning and at bedtime.   APIXABAN (ELIQUIS) 5 MG TABS TABLET    Take 1 tablet (5 mg total) by mouth 2 (two) times daily.   CYANOCOBALAMIN 1000 MCG TABLET    Take 1,000 mcg by mouth in the morning.   DONEPEZIL (ARICEPT) 10 MG TABLET    TAKE 1 TABLET EVERY EVENING   LORAZEPAM (ATIVAN) 0.5 MG TABLET    TAKE 1 TABLET DAILY AS NEEDED FOR ANXIETY   LOSARTAN (COZAAR) 50 MG TABLET    Take 50 mg by mouth in the morning and at bedtime.   MEMANTINE (NAMENDA) 5 MG TABLET    Take 1 tablet (5 mg total) by mouth 2 (two) times daily.   NYSTATIN CREAM (MYCOSTATIN)    Apply 1 application topically 4  (four) times daily.   QUETIAPINE (SEROQUEL) 25 MG TABLET    TAKE 1 TABLET TWICE A DAY   ROSUVASTATIN (CRESTOR) 20 MG TABLET    Take 1 tablet (20 mg total) by mouth daily.  Modified Medications   No medications on file  Discontinued Medications   No medications on file    Physical Exam:  Vitals:   10/18/20 1530  BP: 118/78  Pulse: 79  Resp: 20  Temp: (!) 97.3 F (36.3 C)  TempSrc: Temporal  SpO2: 97%  Weight: 191 lb 9.6 oz (86.9 kg)  Height: 5\' 3"  (1.6 m)   Body mass index is 33.94 kg/m. Wt Readings from Last 3 Encounters:  10/18/20 191 lb 9.6 oz (86.9 kg)  06/07/20 188 lb 6.4 oz (85.5 kg)  02/06/20 185 lb 3.2 oz (84 kg)    Physical Exam Vitals reviewed.  Constitutional:      General: She is not in acute distress.    Appearance: She is obese.  HENT:     Head: Normocephalic.     Right Ear: There is no impacted cerumen.     Left Ear: There is no impacted cerumen.     Nose: Nose normal.     Mouth/Throat:     Mouth: Mucous membranes are moist.  Eyes:     General:        Right eye: No discharge.        Left eye: No discharge.     Pupils: Pupils are equal, round, and reactive to light.  Neck:     Thyroid: No thyroid mass or thyromegaly.  Cardiovascular:     Rate and Rhythm: Normal rate. Rhythm irregular.     Pulses: Normal pulses.     Heart sounds: Normal heart sounds. No murmur heard.   Pulmonary:     Effort: Pulmonary effort is normal. No respiratory distress.     Breath sounds: Normal breath sounds. No wheezing.  Abdominal:     General: Bowel sounds are normal. There is no distension.     Palpations: Abdomen is soft.     Tenderness: There is no abdominal tenderness.  Musculoskeletal:     Cervical back: Normal range of motion.     Right lower leg: No edema.     Left lower leg: No edema.  Lymphadenopathy:     Cervical: No cervical adenopathy.  Skin:    General: Skin is warm and dry.     Capillary Refill: Capillary refill takes less than 2 seconds.   Neurological:     General: No focal deficit present.     Mental Status: She is alert. Mental status is at baseline.  Motor: Weakness present.     Gait: Gait abnormal.     Comments: Slow gait  Psychiatric:        Mood and Affect: Mood normal.        Behavior: Behavior normal.        Cognition and Memory: Memory is impaired.    Labs reviewed: Basic Metabolic Panel: Recent Labs    02/06/20 1632 06/07/20 1604  NA 142 142  K 4.6 4.3  CL 105 108  CO2 31 25  GLUCOSE 82 82  BUN 14 16  CREATININE 0.77 0.94*  CALCIUM 9.7 9.7   Liver Function Tests: Recent Labs    02/06/20 1632 06/07/20 1604  AST 21 16  ALT 25 15  BILITOT 0.5 0.5  PROT 6.6 6.1   No results for input(s): LIPASE, AMYLASE in the last 8760 hours. No results for input(s): AMMONIA in the last 8760 hours. CBC: Recent Labs    02/06/20 1632 06/07/20 1604  WBC 10.0 9.1  NEUTROABS 6,910 6,516  HGB 14.6 13.8  HCT 43.5 40.7  MCV 90.6 90.4  PLT 218 215   Lipid Panel: Recent Labs    06/07/20 1604  CHOL 124  HDL 38*  LDLCALC 62  TRIG 409*  CHOLHDL 3.3   TSH: No results for input(s): TSH in the last 8760 hours. A1C: Lab Results  Component Value Date   HGBA1C 5.9 10/04/2018     Assessment/Plan 1. Weight gain - she has gained > 10 lbs in past year - appetite healthy - TSH to r/o hypothyroidism - recommend limiting calories to < 1500 cal/day - TSH- resulted normal  2. Essential hypertension - bp at goal < 150/90 - cont losartan - continue to limit sodium in diet < 2000 mg/day - CBC with Differential/Platelets- normal blood count  - CMP- normal electrolytes and kidney function  3. Overactive bladder - family did not think it was effective enough for cost - remains incontinent - recommend scheduled toileting every 2 hours  4. Senile osteoporosis - remains high risk for fall with fracture - cont prolia injections  5. Permanent atrial fibrillation (HCC) - rate controlled without  medication - cont Eliquis for DVT prophylaxis  6. Late onset Alzheimer's disease with behavioral disturbance (HCC) - stable, no behavioral outbursts, able to fed self and ambulate - cont namenda   7. Mixed hyperlipidemia - stable with statin - lipid panel- future  8. Anxiety - stable with lorazepam at night  9. Chronic right-sided low back pain without sciatica - stable with scheduled tylenol  10. Functional urinary incontinence - ongoing, recommend scheduled toileting every 2 hours  I provided 32 minutes of face-to-face time during this encounter.     Next appt: Visit date not found Stacey Dalton Scherry Ran  [facility] [phone]

## 2020-10-18 NOTE — Patient Instructions (Signed)
Routine lab work done today  Arthritis Arthritis means joint pain. It can also mean joint disease. A joint is a place where bones come together. There are more than 100 types of arthritis. What are the causes? This condition may be caused by:  Wear and tear of a joint. This is the most common cause.  A lot of acid in the blood, which leads to pain in the joint (gout).  Pain and swelling (inflammation) in a joint.  Infection of a joint.  Injuries in the joint.  A reaction to medicines (allergy). In some cases, the cause may not be known. What are the signs or symptoms? Symptoms of this condition include:  Redness at a joint.  Swelling at a joint.  Stiffness at a joint.  Warmth coming from the joint.  A fever.  A feeling of being sick. How is this treated? This condition may be treated with:  Treating the cause, if it is known.  Rest.  Raising (elevating) the joint.  Putting cold or hot packs on the joint.  Medicines to treat symptoms and reduce pain and swelling.  Shots of medicines (cortisone) into the joint. You may also be told to make changes in your life, such as doing exercises and losing weight. Follow these instructions at home: Medicines  Take over-the-counter and prescription medicines only as told by your doctor.  Do not take aspirin for pain if your doctor says that you may have gout. Activity  Rest your joint if your doctor tells you to.  Avoid activities that make the pain worse.  Exercise your joint regularly as told by your doctor. Try doing exercises like: ? Swimming. ? Water aerobics. ? Biking. ? Walking. Managing pain, stiffness, and swelling  If told, put ice on the affected area. ? Put ice in a plastic bag. ? Place a towel between your skin and the bag. ? Leave the ice on for 20 minutes, 2-3 times per day.  If your joint is swollen, raise (elevate) it above the level of your heart if told by your doctor.  If your joint  feels stiff in the morning, try taking a warm shower.  If told, put heat on the affected area. Do this as often as told by your doctor. Use the heat source that your doctor recommends, such as a moist heat pack or a heating pad. If you have diabetes, do not apply heat without asking your doctor. To apply heat: ? Place a towel between your skin and the heat source. ? Leave the heat on for 20-30 minutes. ? Remove the heat if your skin turns bright red. This is very important if you are unable to feel pain, heat, or cold. You may have a greater risk of getting burned.      General instructions  Do not use any products that contain nicotine or tobacco, such as cigarettes, e-cigarettes, and chewing tobacco. If you need help quitting, ask your doctor.  Keep all follow-up visits as told by your doctor. This is important. Contact a doctor if:  The pain gets worse.  You have a fever. Get help right away if:  You have very bad pain in your joint.  You have swelling in your joint.  Your joint is red.  Many joints become painful and swollen.  You have very bad back pain.  Your leg is very weak.  You cannot control your pee (urine) or poop (stool). Summary  Arthritis means joint pain. It can also mean  joint disease. A joint is a place where bones come together.  The most common cause of this condition is wear and tear of a joint.  Symptoms of this condition include redness, swelling, or stiffness of the joint.  This condition is treated with rest, raising the joint, medicines, and putting cold or hot packs on the joint.  Follow your doctor's instructions about medicines, activity, exercises, and other home care treatments. This information is not intended to replace advice given to you by your health care provider. Make sure you discuss any questions you have with your health care provider. Document Revised: 08/09/2018 Document Reviewed: 08/09/2018 Elsevier Patient Education  2021  [employer].

## 2020-10-19 LAB — COMPREHENSIVE METABOLIC PANEL
AG Ratio: 1.7 (calc) (ref 1.0–2.5)
ALT: 13 U/L (ref 6–29)
AST: 13 U/L (ref 10–35)
Albumin: 4 g/dL (ref 3.6–5.1)
Alkaline phosphatase (APISO): 62 U/L (ref 37–153)
BUN: 20 mg/dL (ref 7–25)
CO2: 25 mmol/L (ref 20–32)
Calcium: 9.3 mg/dL (ref 8.6–10.4)
Chloride: 106 mmol/L (ref 98–110)
Creat: 0.77 mg/dL (ref 0.60–0.88)
Globulin: 2.3 g/dL (calc) (ref 1.9–3.7)
Glucose, Bld: 94 mg/dL (ref 65–139)
Potassium: 4.5 mmol/L (ref 3.5–5.3)
Sodium: 142 mmol/L (ref 135–146)
Total Bilirubin: 0.5 mg/dL (ref 0.2–1.2)
Total Protein: 6.3 g/dL (ref 6.1–8.1)

## 2020-10-19 LAB — CBC WITH DIFFERENTIAL/PLATELET
Absolute Monocytes: 688 cells/uL (ref 200–950)
Basophils Absolute: 48 cells/uL (ref 0–200)
Basophils Relative: 0.6 %
Eosinophils Absolute: 96 cells/uL (ref 15–500)
Eosinophils Relative: 1.2 %
HCT: 41.1 % (ref 35.0–45.0)
Hemoglobin: 13.8 g/dL (ref 11.7–15.5)
Lymphs Abs: 1944 cells/uL (ref 850–3900)
MCH: 30.7 pg (ref 27.0–33.0)
MCHC: 33.6 g/dL (ref 32.0–36.0)
MCV: 91.5 fL (ref 80.0–100.0)
MPV: 10.4 fL (ref 7.5–12.5)
Monocytes Relative: 8.6 %
Neutro Abs: 5224 cells/uL (ref 1500–7800)
Neutrophils Relative %: 65.3 %
Platelets: 222 10*3/uL (ref 140–400)
RBC: 4.49 10*6/uL (ref 3.80–5.10)
RDW: 12.2 % (ref 11.0–15.0)
Total Lymphocyte: 24.3 %
WBC: 8 10*3/uL (ref 3.8–10.8)

## 2020-10-19 LAB — TSH: TSH: 2.65 mIU/L (ref 0.40–4.50)

## 2020-11-05 ENCOUNTER — Encounter: Payer: Self-pay | Admitting: Internal Medicine

## 2020-12-03 ENCOUNTER — Encounter (HOSPITAL_COMMUNITY): Payer: Self-pay

## 2020-12-03 ENCOUNTER — Emergency Department (HOSPITAL_COMMUNITY): Payer: Medicare Other

## 2020-12-03 ENCOUNTER — Other Ambulatory Visit: Payer: Self-pay

## 2020-12-03 ENCOUNTER — Emergency Department (HOSPITAL_COMMUNITY)
Admission: EM | Admit: 2020-12-03 | Discharge: 2020-12-03 | Disposition: A | Discharge status: Home / Self Care | Payer: Medicare Other | Attending: Emergency Medicine | Admitting: Emergency Medicine

## 2020-12-03 DIAGNOSIS — M25551 Pain in right hip: Secondary | ICD-10-CM

## 2020-12-03 DIAGNOSIS — Z7901 Long term (current) use of anticoagulants: Secondary | ICD-10-CM | POA: Insufficient documentation

## 2020-12-03 DIAGNOSIS — I4891 Unspecified atrial fibrillation: Secondary | ICD-10-CM | POA: Diagnosis not present

## 2020-12-03 DIAGNOSIS — E119 Type 2 diabetes mellitus without complications: Secondary | ICD-10-CM | POA: Diagnosis not present

## 2020-12-03 DIAGNOSIS — M25561 Pain in right knee: Secondary | ICD-10-CM | POA: Diagnosis not present

## 2020-12-03 DIAGNOSIS — N390 Urinary tract infection, site not specified: Secondary | ICD-10-CM | POA: Diagnosis not present

## 2020-12-03 DIAGNOSIS — F0390 Unspecified dementia without behavioral disturbance: Secondary | ICD-10-CM | POA: Diagnosis not present

## 2020-12-03 DIAGNOSIS — I10 Essential (primary) hypertension: Secondary | ICD-10-CM | POA: Insufficient documentation

## 2020-12-03 DIAGNOSIS — R413 Other amnesia: Secondary | ICD-10-CM | POA: Insufficient documentation

## 2020-12-03 DIAGNOSIS — Z87891 Personal history of nicotine dependence: Secondary | ICD-10-CM | POA: Insufficient documentation

## 2020-12-03 LAB — URINALYSIS, ROUTINE W REFLEX MICROSCOPIC
Bilirubin Urine: NEGATIVE
Glucose, UA: NEGATIVE mg/dL
Ketones, ur: 5 mg/dL — AB
Nitrite: POSITIVE — AB
Protein, ur: NEGATIVE mg/dL
Specific Gravity, Urine: 1.016 (ref 1.005–1.030)
pH: 6 (ref 5.0–8.0)

## 2020-12-03 MED ORDER — CEPHALEXIN 500 MG PO CAPS
500.0000 mg | ORAL_CAPSULE | Freq: Once | ORAL | Status: AC
  Administered 2020-12-03: 500 mg via ORAL
  Filled 2020-12-03: qty 1

## 2020-12-03 MED ORDER — CEPHALEXIN 500 MG PO CAPS: 500.0000 mg | ORAL_CAPSULE | Freq: Two times a day (BID) | ORAL | 0 refills | Status: DC

## 2020-12-03 NOTE — ED Provider Notes (Signed)
[facility]-EMERGENCY DEPT Provider Note   CSN: [id number] Arrival date & time: 12/03/20  0865     History Chief Complaint  Patient presents with  . Hip Pain    Stacey Dalton is a 86 y.o. female.  86 year old female with past medical history below including dementia, atrial fibrillation on Eliquis who presents with right hip and knee pain.  Staff at patient's SNF reported to EMS that yesterday the patient lowered herself to the floor and was on her right side for a while.  She has reportedly had right hip and right knee pain since then.  No other details noted of the event.  LEVEL 5 CAVEAT DUE TO DEMENTIA  The history is provided by the nursing home and the EMS personnel.  Hip Pain       Past Medical History:  Diagnosis Date  . A-fib (HCC)   . Allergic rhinitis 10/21/2010  . Back pain 10/21/2010  . Chronic back pain 10/21/2010  . Colonic polyp 10/21/2018  . Dementia (HCC)   . Diabetes mellitus without complication (HCC)   . Echocardiogram abnormal 11/09/2018  . Epigastric abdominal pain   . History of recent travel   . Hyperlipidemia   . Hypertension   . Incontinence   . Mixed hyperlipidemia   . S/P cholecystectomy 10/21/2010    Patient Active Problem List   Diagnosis Date Noted  . Overactive bladder 02/06/2020  . Fall 12/22/2019  . SAH (subarachnoid hemorrhage) (HCC) 12/09/2019  . Leg cramps 04/11/2019  . Urinary incontinence 04/11/2019  . Atrial fibrillation (HCC) 10/12/2018  . Right-sided low back pain without sciatica 10/11/2018  . Dementia (HCC) 06/01/2018  . Senile osteoporosis 11/20/2017  . Healthcare maintenance 10/07/2017  . Prediabetes 04/06/2017  . Chest pain 11/11/2015  . Palpitations 11/11/2015  . Primary osteoarthritis of right hand 12/22/2014  . Bilateral cataracts 06/12/2014  . Heme positive stool 12/30/2013  . Heart block, first degree 05/20/2013  . Allergic rhinitis 10/21/2010  . Colonic polyp 10/21/2010  .  Hypertension 10/21/2010  . LVH (left ventricular hypertrophy) due to hypertensive disease 10/21/2010  . Mixed hyperlipidemia 10/21/2010    Past Surgical History:  Procedure Laterality Date  . ABDOMINAL HYSTERECTOMY     partial due to prolapse  . BACK SURGERY  2007  . CATARACT EXTRACTION Left 06/26/2014  . CHOLECYSTECTOMY    . melanoma resection Right 1980     OB History   No obstetric history on file.     Family History  Problem Relation Age of Onset  . Cancer Mother   . Dementia Father        Natural Death  . Hodgkin's lymphoma Son   . Melanoma Son        Seizures    Social History   Tobacco Use  . Smoking status: Former Games developer  . Smokeless tobacco: Never Used  Vaping Use  . Vaping Use: Never used  Substance Use Topics  . Alcohol use: Not Currently  . Drug use: Never    Home Medications Prior to Admission medications   Medication Sig Start Date End Date Taking? Authorizing Provider  Acetaminophen (ACETAMINOPHEN EXTRA STRENGTH) 167 MG/5ML LIQD Take 30 mLs by mouth daily as needed (pain).   Yes [provider]  apixaban (ELIQUIS) 5 MG TABS tablet Take 1 tablet (5 mg total) by mouth 2 (two) times daily. 09/20/20  Yes Reed, Tiffany L, DO  cephALEXin (KEFLEX) 500 MG capsule Take 1 capsule (500 mg total) by mouth 2 (  two) times daily. 12/03/20  Yes Kasin Tonkinson, Ambrose Finlandachel Morgan, MD  cyanocobalamin 1000 MCG tablet Take 1,000 mcg by mouth in the morning.   Yes [provider]  donepezil (ARICEPT) 10 MG tablet TAKE 1 TABLET EVERY EVENING Patient taking differently: Take 10 mg by mouth at bedtime. 07/04/20  Yes Reed, Tiffany L, DO  LORazepam (ATIVAN) 0.5 MG tablet TAKE 1 TABLET DAILY AS NEEDED FOR ANXIETY Patient taking differently: Take 0.5 mg by mouth daily as needed for anxiety. 09/20/20  Yes Reed, Tiffany L, DO  losartan (COZAAR) 50 MG tablet Take 50 mg by mouth in the morning and at bedtime.   Yes [provider]  memantine (NAMENDA) 5 MG tablet Take 1  tablet (5 mg total) by mouth 2 (two) times daily. 07/05/20  Yes Reed, Tiffany L, DO  nystatin cream (MYCOSTATIN) Apply 1 application topically 4 (four) times daily as needed (abdominal skin fold rash).   Yes [provider]  QUEtiapine (SEROQUEL) 25 MG tablet TAKE 1 TABLET TWICE A DAY Patient taking differently: Take 25 mg by mouth 2 (two) times daily. 09/17/20  Yes Reed, Tiffany L, DO  rosuvastatin (CRESTOR) 20 MG tablet Take 1 tablet (20 mg total) by mouth daily. 07/05/20  Yes Reed, Tiffany L, DO    Allergies    Chamomile flowers [chamomile], Lisinopril, Mixed ragweed, and Other  Review of Systems   Review of Systems  Unable to perform ROS: Dementia    Physical Exam Updated Vital Signs BP 131/82   Pulse (!) 105   Temp 98.4 F (36.9 C) (Oral)   Resp (!) 23   SpO2 97%   Physical Exam Constitutional:      General: She is not in acute distress.    Appearance: Normal appearance.  HENT:     Head: Normocephalic and atraumatic.  Eyes:     Conjunctiva/sclera: Conjunctivae normal.  Cardiovascular:     Rate and Rhythm: Normal rate and regular rhythm.     Heart sounds: Normal heart sounds. No murmur heard.   Pulmonary:     Effort: Pulmonary effort is normal.     Breath sounds: Normal breath sounds.  Chest:     Chest wall: No tenderness.  Abdominal:     General: Abdomen is flat. Bowel sounds are normal. There is no distension.     Palpations: Abdomen is soft.     Tenderness: There is no abdominal tenderness.  Musculoskeletal:        General: No swelling or deformity.     Right lower leg: No edema.     Left lower leg: No edema.     Comments: Normal ROM BUE, LLE; pain with flexion of R hip and R knee, no obvious knee effusion or trauma; R leg slightly shortened compared to L  Skin:    General: Skin is warm and dry.  Neurological:     Mental Status: She is alert.     Comments: Alert, mumbling speech  Psychiatric:        Behavior: Behavior is cooperative.         Cognition and Memory: Cognition is impaired. Memory is impaired.     ED Results / Procedures / Treatments   Labs (all labs ordered are listed, but only abnormal results are displayed) Labs Reviewed  URINALYSIS, ROUTINE W REFLEX MICROSCOPIC - Abnormal; Notable for the following components:      Result Value   APPearance HAZY (*)    Hgb urine dipstick SMALL (*)    Ketones,  ur 5 (*)    Nitrite POSITIVE (*)    Leukocytes,Ua TRACE (*)    Bacteria, UA MANY (*)    All other components within normal limits  URINE CULTURE    EKG None  Radiology CT Head Wo Contrast  Result Date: 12/03/2020 CLINICAL DATA:  86 year old female status post fall, on Eliquis. EXAM: CT HEAD WITHOUT CONTRAST TECHNIQUE: Contiguous axial images were obtained from the base of the skull through the vertex without intravenous contrast. COMPARISON:  None. FINDINGS: Brain: Generalized cerebral volume loss. No midline shift, ventriculomegaly, mass effect, evidence of mass lesion, intracranial hemorrhage or evidence of cortically based acute infarction. Patchy and confluent bilateral white matter hypodensity. No cortical encephalomalacia identified. Vascular: No suspicious intracranial vascular hyperdensity. Skull: Negative.  No fracture identified. Sinuses/Orbits: Visualized paranasal sinuses and mastoids are clear. Other: No orbit or scalp soft tissue injury identified. IMPRESSION: 1. No acute traumatic injury identified. 2. Cerebral volume loss and moderate white matter disease. Electronically Signed   By: Odessa Fleming M.D.   On: 12/03/2020 08:16   CT Hip Right Wo Contrast  Result Date: 12/03/2020 CLINICAL DATA:  Right hip pain.  Fell. EXAM: CT OF THE RIGHT HIP WITHOUT CONTRAST TECHNIQUE: Multidetector CT imaging of the right hip was performed according to the standard protocol. Multiplanar CT image reconstructions were also generated. COMPARISON:  Radiographs 12/03/2020 FINDINGS: The right hip is normally located. Minimal/mild  degenerative changes for the patient's age. No hip fracture or AVN. The bony pelvis on the right side is intact. No acetabular or pubic bone fractures. Advanced degenerative changes are noted at the pubic symphysis. The right SI joint is intact. The right hip and pelvic musculature is grossly normal by CT. No obvious muscle tear or intramuscular hematoma. No mass. Curvilinear band of calcifications are noted just deep to the lateral thigh fascia. This is most likely related to remote trauma with dystrophic calcification. No involvement of the underlying musculature. This is well below the trochanteric bursa. No significant intrapelvic abnormalities are identified on the right side. There is advanced sigmoid colon diverticulosis noted. No right-sided inguinal hernia or adenopathy. IMPRESSION: 1. Minimal/mild degenerative changes for the patient's age. No hip fracture or AVN. 2. Advanced degenerative changes at the pubic symphysis. No pelvic fractures or bone lesions. 3. Curvilinear band of calcifications just deep to the lateral thigh fascia. This is most likely related to remote trauma with dystrophic calcification. No involvement of the underlying musculature. 4. No significant intrapelvic abnormalities on the right side. Electronically Signed   By: Rudie Meyer M.D.   On: 12/03/2020 11:25   DG Knee Complete 4 Views Right  Result Date: 12/03/2020 CLINICAL DATA:  Possible fall.  Pain. EXAM: RIGHT KNEE - COMPLETE 4+ VIEW COMPARISON:  None. FINDINGS: No acute fracture or dislocation. No joint effusion. Vascular calcifications. Mild for age 70 compartment osteoarthritis. IMPRESSION: No acute osseous abnormality. Electronically Signed   By: Jeronimo Greaves M.D.   On: 12/03/2020 08:18   DG Hip Unilat With Pelvis 2-3 Views Right  Result Date: 12/03/2020 CLINICAL DATA:  Possible fall, right hip pain EXAM: DG HIP (WITH OR WITHOUT PELVIS) 2-3V RIGHT COMPARISON:  None. FINDINGS: Osteopenia. There is no evidence of hip  fracture or dislocation. There is no evidence of arthropathy or other focal bone abnormality. Probable heterotopic ossification in the soft tissues overlying the proximal right femoral metadiaphysis. IMPRESSION: Osteopenia. No displaced fracture or dislocation of the right hip or included pelvis. Please note that plain radiographs are significantly insensitive  for hip and pelvic fracture, particular in the setting of osteopenia. Recommend CT or MRI to more sensitively evaluate for fracture if there is high clinical suspicion. Electronically Signed   By: Lauralyn Primes M.D.   On: 12/03/2020 08:13    Procedures Procedures   Medications Ordered in ED Medications  cephALEXin (KEFLEX) capsule 500 mg (has no administration in time range)    ED Course  I have reviewed the triage vital signs and the nursing notes.  Pertinent labs & imaging results that were available during my care of the patient were reviewed by me and considered in my medical decision making (see chart for details).    MDM Rules/Calculators/A&P                          Alert and comfortable on exam, normal vital signs, no external signs of trauma.  Plain films of right hip and knee without acute injury.  Obtain CT of hip given patient's dementia and apparent pain with range of motion.  CT negative acute.  The patient's daughter arrived and stated that the patient has seemed somewhat more unsteady on her feet this past week.  She inquired about the possibility of a UTI.  Obtained urine which is consistent with infection.  Patient has been well-appearing and afebrile here therefore I feel she is appropriate for outpatient antibiotics.  Gave course of Keflex.  Discussed with daughter return precautions and she voiced understanding.  Patient discharged back to her nursing facility. Final Clinical Impression(s) / ED Diagnoses Final diagnoses:  Urinary tract infection without hematuria, site unspecified  Right hip pain    Rx / DC  Orders ED Discharge Orders         Ordered    cephALEXin (KEFLEX) 500 MG capsule  2 times daily        12/03/20 1414           Dailee Manalang, Ambrose Finland, MD 12/03/20 1417

## 2020-12-03 NOTE — Discharge Instructions (Signed)
You have a urinary tract infection and have been prescribed antibiotics.  Return to ER if any change in mental status, fevers, vomiting, or lethargy.

## 2020-12-03 NOTE — ED Triage Notes (Addendum)
Pt arrives EMS from [facility] with c/o right hip and right knee pain. Hx dementia. Per staff pt lowered self to floor yesterday and was on right side for a while. Takes Eliquis.

## 2020-12-05 LAB — URINE CULTURE: Culture: >=100000 — AB

## 2020-12-06 ENCOUNTER — Telehealth: Payer: Self-pay | Admitting: *Deleted

## 2020-12-06 NOTE — Progress Notes (Signed)
ED Antimicrobial Stewardship Positive Culture Follow Up   Stacey Dalton is an 86 y.o. female who presented to [facility] on 12/03/2020 with a chief complaint of  Chief Complaint  Patient presents with  . Hip Pain    Recent Results (from the past 720 hour(s))  Urine culture     Status: Abnormal   Collection Time: 12/03/20 12:40 PM   Specimen: Urine, Random  Result Value Ref Range Status   Specimen Description   Final    URINE, RANDOM Performed at [facility], [address], [place], [place] [address]    Special Requests   Final    NONE Performed at [facility], [address], [place], [place] [address]    Culture >=100,000 COLONIES/mL ESCHERICHIA COLI (A)  Final   Report Status 12/05/2020 FINAL  Final   Organism ID, Bacteria ESCHERICHIA COLI (A)  Final      Susceptibility   Escherichia coli - MIC*    AMPICILLIN <=2 SENSITIVE Sensitive     CEFAZOLIN <=4 SENSITIVE Sensitive     CEFEPIME <=0.12 SENSITIVE Sensitive     CEFTRIAXONE <=0.25 SENSITIVE Sensitive     CIPROFLOXACIN <=0.25 SENSITIVE Sensitive     GENTAMICIN <=1 SENSITIVE Sensitive     IMIPENEM <=0.25 SENSITIVE Sensitive     NITROFURANTOIN <=16 SENSITIVE Sensitive     TRIMETH/SULFA <=20 SENSITIVE Sensitive     AMPICILLIN/SULBACTAM <=2 SENSITIVE Sensitive     PIP/TAZO <=4 SENSITIVE Sensitive     * >=100,000 COLONIES/mL ESCHERICHIA COLI    Presented with R hip and knee pain in setting of dementia. UA significant for pyuria, leukocytes and culture grew 100K E.coli. Nurse will call patient to assess symptoms - if persisting weakness, continue antibiotics. If weakness is resolved, stop antibiotics after three day course as likely asymptomatic bacteruria.  ED Provider: Alveria Apley, PA-C   [name]  [facility] PharmD Candidate 2022 12/06/2020, 8:40 AM Monday - Friday phone -  [phone] Saturday - Sunday phone - [phone]

## 2020-12-06 NOTE — Telephone Encounter (Signed)
Post ED Visit - Positive Culture Follow-up: Successful Patient Follow-Up  Culture assessed and recommendations reviewed by:  []  , Pharm.D. []  Enzo Bi, Pharm.D., BCPS AQ-ID []  , Pharm.D., BCPS []  Celedonio Miyamoto, Pharm.D., BCPS []  [place], Garvin Fila.D., BCPS, AAHIVP []  , Pharm.D., BCPS, AAHIVP []  Georgina Pillion, PharmD, BCPS []  , PharmD, BCPS []  [place], PharmD, BCPS []  [address], PharmD  Positive urine culture  []  Patient discharged without antimicrobial prescription and treatment is now indicated []  Organism is resistant to prescribed ED discharge antimicrobial []  Patient with positive blood cultures  Changes discussed with ED provider: , [facility] Faxed results and directions to [facility], [facility] [place] [phone], Att:     Contacted patient, date 12/06/2020, time 0849   12/06/2020, 8:47 AM

## 2020-12-10 ENCOUNTER — Ambulatory Visit (INDEPENDENT_AMBULATORY_CARE_PROVIDER_SITE_OTHER): Payer: Medicare Other | Admitting: Nurse Practitioner

## 2020-12-10 ENCOUNTER — Telehealth: Payer: Self-pay

## 2020-12-10 ENCOUNTER — Other Ambulatory Visit: Payer: Self-pay

## 2020-12-10 ENCOUNTER — Encounter: Payer: Self-pay | Admitting: Nurse Practitioner

## 2020-12-10 VITALS — BP 128/72 | HR 76 | Temp 96.6°F | Ht 63.0 in | Wt 188.0 lb

## 2020-12-10 DIAGNOSIS — N3000 Acute cystitis without hematuria: Secondary | ICD-10-CM

## 2020-12-10 DIAGNOSIS — R296 Repeated falls: Secondary | ICD-10-CM | POA: Diagnosis not present

## 2020-12-10 DIAGNOSIS — R5381 Other malaise: Secondary | ICD-10-CM | POA: Diagnosis not present

## 2020-12-10 DIAGNOSIS — M545 Low back pain, unspecified: Secondary | ICD-10-CM | POA: Diagnosis not present

## 2020-12-10 DIAGNOSIS — F419 Anxiety disorder, unspecified: Secondary | ICD-10-CM

## 2020-12-10 DIAGNOSIS — F0281 Dementia in other diseases classified elsewhere with behavioral disturbance: Secondary | ICD-10-CM

## 2020-12-10 DIAGNOSIS — F02818 Dementia in other diseases classified elsewhere, unspecified severity, with other behavioral disturbance: Secondary | ICD-10-CM

## 2020-12-10 DIAGNOSIS — G301 Alzheimer's disease with late onset: Secondary | ICD-10-CM

## 2020-12-10 DIAGNOSIS — G8929 Other chronic pain: Secondary | ICD-10-CM

## 2020-12-10 MED ORDER — QUETIAPINE FUMARATE 25 MG PO TABS: 12.5000 mg | ORAL_TABLET | Freq: Two times a day (BID) | ORAL | 1 refills | Status: DC

## 2020-12-10 MED ORDER — ACETAMINOPHEN EXTRA STRENGTH 500 MG/15ML PO LIQD: 30.0000 mL | Freq: Two times a day (BID) | ORAL | 0 refills | Status: DC

## 2020-12-10 NOTE — Telephone Encounter (Signed)
Daughter [name] called stating that mother seems to be falling frequently, shaking and poor appetite. She states that facility found mother on the floor on her knees beside her bed not really responding. They were finally able to get her up and eating. They don't know whether patient fell or not. She seems to be more confused. She eas taking to the ER last week and found to have a UTI. Patient still having issues. Patient was giving appointment today at 2:15 with Abbey Chatters, NP.

## 2020-12-10 NOTE — Patient Instructions (Addendum)
Decrease Seroquel to 12.5 mg by mouth twice daily.   To schedule tylenol 1000 mg by mouth twice daily while awake

## 2020-12-10 NOTE — Progress Notes (Signed)
Careteam: Patient Care Team: Sharon Seller, NP as PCP - General (Geriatric Medicine)  PLACE OF SERVICE:  [facility]  Advanced Directive information    Allergies  Allergen Reactions  . Chamomile Flowers [Chamomile]   . Lisinopril   . Mixed Ragweed   . Other     Seasonal allergies, cats    Chief Complaint  Patient presents with  . Acute Visit    Frequent falls (moderate fall risk), poor appetite, and shaking. Here with daughter [name]      HPI: Patient is a 86 y.o. female for follow up. 10 days ago daughter noticed pt was shaking and told the nurses at [facility] to keep an eye on her. She then progressed to be shaky on her feet. Slower to respond than usual. She has a hx of dementia. Then 8 days ago she had a fall in her doorway at her apartment.  7 days ago they called EMS and took her to the hospital due to fall and wanted her to be evaluated. Did xray and evaluation. UA came back positive and they prescribed Keflex. She will complete this tomorrow but symptoms have not improved.  She continues to have falls.  She was previously ambulatory but now in a wheelchair. More lethargic, not eating well.  Daughter spent the day with her today. She needs more assistance to walk (she is trying to work on her own) She ate well today but daughter had to prompt her to eat.  Increase twitching when she is sleeping. Daughter noticed yesterday during her nap.   She will have behaviors in the morning, spitting out pills and throwing him across the room. Afternoons with some behaviors and sundowners. Mood switched up in the afternoon.   Review of Systems:  Review of Systems  Constitutional: Negative for chills, fever and weight loss.  HENT: Negative for tinnitus.   Respiratory: Negative for cough, sputum production and shortness of breath.   Cardiovascular: Negative for chest pain, palpitations and leg swelling.  Gastrointestinal: Negative for abdominal pain, constipation, diarrhea  and heartburn.  Genitourinary: Negative for dysuria, frequency and urgency.  Musculoskeletal: Positive for falls. Negative for back pain, joint pain and myalgias.  Skin: Negative.   Neurological: Positive for weakness. Negative for dizziness, tremors and headaches.  Psychiatric/Behavioral: Positive for memory loss. Negative for depression. The patient does not have insomnia.     Past Medical History:  Diagnosis Date  . A-fib (HCC)   . Allergic rhinitis 10/21/2010  . Back pain 10/21/2010  . Chronic back pain 10/21/2010  . Colonic polyp 10/21/2018  . Dementia (HCC)   . Diabetes mellitus without complication (HCC)   . Echocardiogram abnormal 11/09/2018  . Epigastric abdominal pain   . History of recent travel   . Hyperlipidemia   . Hypertension   . Incontinence   . Mixed hyperlipidemia   . S/P cholecystectomy 10/21/2010   Past Surgical History:  Procedure Laterality Date  . ABDOMINAL HYSTERECTOMY     partial due to prolapse  . BACK SURGERY  2007  . CATARACT EXTRACTION Left 06/26/2014  . CHOLECYSTECTOMY    . melanoma resection Right 1980   Social History:   reports that she has quit smoking. She has never used smokeless tobacco. She reports previous alcohol use. She reports that she does not use drugs.  Family History  Problem Relation Age of Onset  . Cancer Mother   . Dementia Father        Natural Death  .  Hodgkin's lymphoma Son   . Melanoma Son        Seizures    Medications: Patient's Medications  New Prescriptions   No medications on file  Previous Medications   ACETAMINOPHEN (ACETAMINOPHEN EXTRA STRENGTH) 167 MG/5ML LIQD    Take 30 mLs by mouth daily as needed (pain).   APIXABAN (ELIQUIS) 5 MG TABS TABLET    Take 1 tablet (5 mg total) by mouth 2 (two) times daily.   CEPHALEXIN (KEFLEX) 500 MG CAPSULE    Take 1 capsule (500 mg total) by mouth 2 (two) times daily.   CYANOCOBALAMIN 1000 MCG TABLET    Take 1,000 mcg by mouth in the morning.   DONEPEZIL (ARICEPT)  10 MG TABLET    TAKE 1 TABLET EVERY EVENING   LORAZEPAM (ATIVAN) 0.5 MG TABLET    TAKE 1 TABLET DAILY AS NEEDED FOR ANXIETY   LOSARTAN (COZAAR) 50 MG TABLET    Take 50 mg by mouth in the morning and at bedtime.   MEMANTINE (NAMENDA) 5 MG TABLET    Take 1 tablet (5 mg total) by mouth 2 (two) times daily.   NYSTATIN CREAM (MYCOSTATIN)    Apply 1 application topically 4 (four) times daily as needed (abdominal skin fold rash).   QUETIAPINE (SEROQUEL) 25 MG TABLET    TAKE 1 TABLET TWICE A DAY   ROSUVASTATIN (CRESTOR) 20 MG TABLET    Take 1 tablet (20 mg total) by mouth daily.  Modified Medications   No medications on file  Discontinued Medications   No medications on file    Physical Exam:  Vitals:   12/10/20 1524  Temp: (!) 96.6 F (35.9 C)  TempSrc: Temporal  Weight: 188 lb (85.3 kg)  Height: 5\' 3"  (1.6 m)   Body mass index is 33.3 kg/m. Wt Readings from Last 3 Encounters:  12/10/20 188 lb (85.3 kg)  10/18/20 191 lb 9.6 oz (86.9 kg)  06/07/20 188 lb 6.4 oz (85.5 kg)    Physical Exam Constitutional:      General: She is not in acute distress.    Appearance: She is well-developed. She is not diaphoretic.  HENT:     Head: Normocephalic and atraumatic.     Mouth/Throat:     Pharynx: No oropharyngeal exudate.  Eyes:     Conjunctiva/sclera: Conjunctivae normal.     Pupils: Pupils are equal, round, and reactive to light.  Cardiovascular:     Rate and Rhythm: Normal rate and regular rhythm.     Heart sounds: Normal heart sounds.  Pulmonary:     Effort: Pulmonary effort is normal.     Breath sounds: Normal breath sounds.  Abdominal:     General: Bowel sounds are normal.     Palpations: Abdomen is soft.  Musculoskeletal:        General: Tenderness (noted to right knee) present.     Cervical back: Normal range of motion and neck supple.  Skin:    General: Skin is warm and dry.  Neurological:     Mental Status: She is easily aroused. She is lethargic.     Motor: Weakness  present.     Gait: Gait abnormal.     Comments: In wheelchair     Labs reviewed: Basic Metabolic Panel: Recent Labs    02/06/20 1632 06/07/20 1604 10/18/20 1621  NA 142 142 142  K 4.6 4.3 4.5  CL 105 108 106  CO2 31 25 25   GLUCOSE 82 82 94  BUN 14 16  20  CREATININE 0.77 0.94* 0.77  CALCIUM 9.7 9.7 9.3  TSH  --   --  2.65   Liver Function Tests: Recent Labs    02/06/20 1632 06/07/20 1604 10/18/20 1621  AST 21 16 13   ALT 25 15 13   BILITOT 0.5 0.5 0.5  PROT 6.6 6.1 6.3   No results for input(s): LIPASE, AMYLASE in the last 8760 hours. No results for input(s): AMMONIA in the last 8760 hours. CBC: Recent Labs    02/06/20 1632 06/07/20 1604 10/18/20 1621  WBC 10.0 9.1 8.0  NEUTROABS 6,910 6,516 5,224  HGB 14.6 13.8 13.8  HCT 43.5 40.7 41.1  MCV 90.6 90.4 91.5  PLT 218 215 222   Lipid Panel: Recent Labs    06/07/20 1604  CHOL 124  HDL 38*  LDLCALC 62  TRIG 12/16/20*  CHOLHDL 3.3   TSH: Recent Labs    10/18/20 1621  TSH 2.65   A1C: Lab Results  Component Value Date   HGBA1C 5.9 10/04/2018     Assessment/Plan 1. Recurrent falls -recently found to have UTI which could contribute. She is also on seroquel which could be causing more gait disturbances, will reduce dose at this time.  - CBC with Differential/Platelet - COMPLETE METABOLIC PANEL WITH GFR - Ambulatory referral to Home Health  2. Acute cystitis without hematuria -continue treatment of keflex until complete. Increase hydration.   3. Debility -gait abnormality, increase falls. In wheelchair at this time for safety. Fall precautions in facility encouraged.  - Ambulatory referral to Home Health PT/OT for evaluation and treatment, also to evaluate assistive devices.  4. Chronic right-sided low back pain without sciatica -she has chronic back pain, now with recent falls. Will schedule tylenol to make sure she is getting adequate pain relief.  - Acetaminophen (ACETAMINOPHEN EXTRA STRENGTH)  167 MG/5ML LIQD; Take 30 mLs (1,002 mg total) by mouth 2 (two) times daily.  Dispense: 237 mL; Refill: 0  5. Late onset Alzheimer's disease with behavioral disturbance (HCC) -ongoing, will reduce Seroquel due to the increase in falls at this time.  - QUEtiapine (SEROQUEL) 25 MG tablet; Take 0.5 tablets (12.5 mg total) by mouth 2 (two) times daily.  Dispense: 90 tablet; Refill: 1  6. Anxiety May benefit from cymbalta due to pain and anxiety- will discuss in more detail at next appt.  Next appt: 3 months as scheduled.  12/16/20. 10/06/2018  [facility] [phone]

## 2020-12-11 ENCOUNTER — Telehealth: Payer: Self-pay | Admitting: *Deleted

## 2020-12-11 DIAGNOSIS — F0281 Dementia in other diseases classified elsewhere with behavioral disturbance: Secondary | ICD-10-CM

## 2020-12-11 DIAGNOSIS — F02818 Dementia in other diseases classified elsewhere, unspecified severity, with other behavioral disturbance: Secondary | ICD-10-CM

## 2020-12-11 LAB — CBC WITH DIFFERENTIAL/PLATELET
Absolute Monocytes: 798 cells/uL (ref 200–950)
Basophils Absolute: 42 cells/uL (ref 0–200)
Basophils Relative: 0.5 %
Eosinophils Absolute: 92 cells/uL (ref 15–500)
Eosinophils Relative: 1.1 %
HCT: 42.6 % (ref 35.0–45.0)
Hemoglobin: 14.4 g/dL (ref 11.7–15.5)
Lymphs Abs: 2234 cells/uL (ref 850–3900)
MCH: 30.7 pg (ref 27.0–33.0)
MCHC: 33.8 g/dL (ref 32.0–36.0)
MCV: 90.8 fL (ref 80.0–100.0)
MPV: 10.8 fL (ref 7.5–12.5)
Monocytes Relative: 9.5 %
Neutro Abs: 5233 cells/uL (ref 1500–7800)
Neutrophils Relative %: 62.3 %
Platelets: 260 10*3/uL (ref 140–400)
RBC: 4.69 10*6/uL (ref 3.80–5.10)
RDW: 12 % (ref 11.0–15.0)
Total Lymphocyte: 26.6 %
WBC: 8.4 10*3/uL (ref 3.8–10.8)

## 2020-12-11 LAB — COMPLETE METABOLIC PANEL WITH GFR
AG Ratio: 1.7 (calc) (ref 1.0–2.5)
ALT: 21 U/L (ref 6–29)
AST: 16 U/L (ref 10–35)
Albumin: 4.1 g/dL (ref 3.6–5.1)
Alkaline phosphatase (APISO): 53 U/L (ref 37–153)
BUN: 17 mg/dL (ref 7–25)
CO2: 25 mmol/L (ref 20–32)
Calcium: 9.1 mg/dL (ref 8.6–10.4)
Chloride: 106 mmol/L (ref 98–110)
Creat: 0.72 mg/dL (ref 0.60–0.88)
GFR, Est African American: 90 mL/min/{1.73_m2} (ref >=60)
GFR, Est Non African American: 77 mL/min/{1.73_m2} (ref >=60)
Globulin: 2.4 g/dL (calc) (ref 1.9–3.7)
Glucose, Bld: 108 mg/dL (ref 65–139)
Potassium: 4.3 mmol/L (ref 3.5–5.3)
Sodium: 141 mmol/L (ref 135–146)
Total Bilirubin: 0.7 mg/dL (ref 0.2–1.2)
Total Protein: 6.5 g/dL (ref 6.1–8.1)

## 2020-12-11 MED ORDER — QUETIAPINE FUMARATE 25 MG PO TABS: 25.0000 mg | ORAL_TABLET | Freq: Every day | ORAL | 1 refills | Status: DC

## 2020-12-11 NOTE — Telephone Encounter (Signed)
New order sent to pharmacy.

## 2020-12-11 NOTE — Telephone Encounter (Signed)
Patient daughter, [name], called and stated that patient's Seroquel was Decreased yesterday at OV to 12.5mg  twice daily.  Daughter stated that the facility, [facility], does not have a way to split the pill in half and has no orders.   Daughter is wanting to know if patient could just take 25mg  of Seroquel in the evening once daily instead of splitting it up.   Please Advise.

## 2020-12-11 NOTE — Telephone Encounter (Signed)
[name] called to check the status of message, per [name] she would like for patient to remain on one dose in the pm  Message sent to Sharon Seller, NP to review response and change rx if in agreement. Once reply is received from Sharon Seller, NP, daughter is requesting that we fax this message to [facility] for documentation purposes.

## 2020-12-11 NOTE — Telephone Encounter (Signed)
Telephone encounter and copy of rx routed electronically to [facility] @ [facility]

## 2020-12-11 NOTE — Telephone Encounter (Signed)
We could try this or we could send in a new Rx to pharmacy

## 2020-12-13 ENCOUNTER — Encounter (HOSPITAL_COMMUNITY): Payer: Self-pay

## 2020-12-13 ENCOUNTER — Emergency Department (HOSPITAL_COMMUNITY)
Admission: EM | Admit: 2020-12-13 | Discharge: 2020-12-14 | Disposition: A | Discharge status: Home / Self Care | Payer: Medicare Other | Attending: Emergency Medicine | Admitting: Emergency Medicine

## 2020-12-13 ENCOUNTER — Emergency Department (HOSPITAL_COMMUNITY): Payer: Medicare Other

## 2020-12-13 DIAGNOSIS — Z79899 Other long term (current) drug therapy: Secondary | ICD-10-CM | POA: Diagnosis not present

## 2020-12-13 DIAGNOSIS — R251 Tremor, unspecified: Secondary | ICD-10-CM | POA: Insufficient documentation

## 2020-12-13 DIAGNOSIS — Z7901 Long term (current) use of anticoagulants: Secondary | ICD-10-CM | POA: Insufficient documentation

## 2020-12-13 DIAGNOSIS — I10 Essential (primary) hypertension: Secondary | ICD-10-CM | POA: Insufficient documentation

## 2020-12-13 DIAGNOSIS — R630 Anorexia: Secondary | ICD-10-CM | POA: Insufficient documentation

## 2020-12-13 DIAGNOSIS — F0390 Unspecified dementia without behavioral disturbance: Secondary | ICD-10-CM | POA: Diagnosis not present

## 2020-12-13 DIAGNOSIS — R5383 Other fatigue: Secondary | ICD-10-CM

## 2020-12-13 DIAGNOSIS — E119 Type 2 diabetes mellitus without complications: Secondary | ICD-10-CM | POA: Insufficient documentation

## 2020-12-13 DIAGNOSIS — Z8659 Personal history of other mental and behavioral disorders: Secondary | ICD-10-CM

## 2020-12-13 DIAGNOSIS — I4891 Unspecified atrial fibrillation: Secondary | ICD-10-CM | POA: Diagnosis not present

## 2020-12-13 DIAGNOSIS — Z87891 Personal history of nicotine dependence: Secondary | ICD-10-CM | POA: Insufficient documentation

## 2020-12-13 LAB — CBC WITH DIFFERENTIAL/PLATELET
Abs Immature Granulocytes: 0.03 10*3/uL (ref 0.00–0.07)
Basophils Absolute: 0 10*3/uL (ref 0.0–0.1)
Basophils Relative: 1 %
Eosinophils Absolute: 0.1 10*3/uL (ref 0.0–0.5)
Eosinophils Relative: 2 %
HCT: 45.3 % (ref 36.0–46.0)
Hemoglobin: 14.7 g/dL (ref 12.0–15.0)
Immature Granulocytes: 0 %
Lymphocytes Relative: 27 %
Lymphs Abs: 2.3 10*3/uL (ref 0.7–4.0)
MCH: 30.5 pg (ref 26.0–34.0)
MCHC: 32.5 g/dL (ref 30.0–36.0)
MCV: 94 fL (ref 80.0–100.0)
Monocytes Absolute: 0.8 10*3/uL (ref 0.1–1.0)
Monocytes Relative: 9 %
Neutro Abs: 5 10*3/uL (ref 1.7–7.7)
Neutrophils Relative %: 61 %
Platelets: 246 10*3/uL (ref 150–400)
RBC: 4.82 MIL/uL (ref 3.87–5.11)
RDW: 12.5 % (ref 11.5–15.5)
WBC: 8.3 10*3/uL (ref 4.0–10.5)
nRBC: 0 % (ref 0.0–0.2)

## 2020-12-13 LAB — COMPREHENSIVE METABOLIC PANEL
ALT: 21 U/L (ref 0–44)
AST: 18 U/L (ref 15–41)
Albumin: 3.7 g/dL (ref 3.5–5.0)
Alkaline Phosphatase: 45 U/L (ref 38–126)
Anion gap: 6 (ref 5–15)
BUN: 17 mg/dL (ref 8–23)
CO2: 25 mmol/L (ref 22–32)
Calcium: 9 mg/dL (ref 8.9–10.3)
Chloride: 107 mmol/L (ref 98–111)
Creatinine, Ser: 0.74 mg/dL (ref 0.44–1.00)
GFR, Estimated: >60 mL/min (ref >60)
Glucose, Bld: 103 mg/dL — ABNORMAL HIGH (ref 70–99)
Potassium: 3.9 mmol/L (ref 3.5–5.1)
Sodium: 138 mmol/L (ref 135–145)
Total Bilirubin: 0.7 mg/dL (ref 0.3–1.2)
Total Protein: 6.2 g/dL — ABNORMAL LOW (ref 6.5–8.1)

## 2020-12-13 LAB — TSH: TSH: 2.173 u[IU]/mL (ref 0.350–4.500)

## 2020-12-13 MED ORDER — QUETIAPINE FUMARATE 25 MG PO TABS
25.0000 mg | ORAL_TABLET | Freq: Once | ORAL | Status: AC
  Administered 2020-12-13: 25 mg via ORAL
  Filled 2020-12-13 (×2): qty 1

## 2020-12-13 MED ORDER — LACTATED RINGERS IV BOLUS
500.0000 mL | Freq: Once | INTRAVENOUS | Status: AC
  Administered 2020-12-13: 500 mL via INTRAVENOUS

## 2020-12-13 NOTE — ED Notes (Signed)
ED Provider at bedside. 

## 2020-12-13 NOTE — ED Triage Notes (Signed)
From memory care [facility]. Daughter was eating dinner wit her and noticed increased lethargy and deterioration form baseline x 10 days, less verbal, more lethargic. Recent UTI, completed antibiotic Tuesday, had f/u with PCP WBC normal, urine was not checked. Seroquel was changed from BID to bedtime only recently. No falls, negative stroke per EMS. VSS-BP 104/72, HR 80, sat 98% RA, temp 97.7, CBG 125.

## 2020-12-13 NOTE — ED Provider Notes (Signed)
[facility] EMERGENCY DEPARTMENT Provider Note   CSN: [id number] Arrival date & time: 12/13/20  1755     History Chief Complaint  Patient presents with  . Fatigue    Stacey Dalton is a 86 y.o. female.  Patient is a 86 year old female with a history of dementia who lives in a memory care unit who presents with fatigue.  Patient is unable to provide further history as she is very minimally conversive at baseline per her daughter.  History was provided by her daughter over the phone.  She reports that her mother has been gradually declining over the past few weeks.  In particular though patient was more fatigued and lethargic today than she has been in the past.  Her daughter reports a 2-week history of falls and unsteadiness on her feet.  Additionally she reports intermittent shaking.  Patient has gradually decreased her appetite and has not wanted to participate in activities.  When she went to visit her this evening she was falling asleep in bed and did not want eat dinner.  This is different from patient's baseline and so her daughter urged them to call EMS to bring her to the emergency room for evaluation.  Patient was recently treated for a urinary tract infection diagnosed on 21 March.  Patient has finished her Keflex antibiotic since then.  Patient is noncommunicative at baseline per the daughter.  She has been in a wheelchair over the past few days at the facility, however this is not her baseline.        Past Medical History:  Diagnosis Date  . A-fib (HCC)   . Allergic rhinitis 10/21/2010  . Back pain 10/21/2010  . Chronic back pain 10/21/2010  . Colonic polyp 10/21/2018  . Dementia (HCC)   . Diabetes mellitus without complication (HCC)   . Echocardiogram abnormal 11/09/2018  . Epigastric abdominal pain   . History of recent travel   . Hyperlipidemia   . Hypertension   . Incontinence   . Mixed hyperlipidemia   . S/P cholecystectomy 10/21/2010     Patient Active Problem List   Diagnosis Date Noted  . Overactive bladder 02/06/2020  . Fall 12/22/2019  . SAH (subarachnoid hemorrhage) (HCC) 12/09/2019  . Leg cramps 04/11/2019  . Urinary incontinence 04/11/2019  . Atrial fibrillation (HCC) 10/12/2018  . Right-sided low back pain without sciatica 10/11/2018  . Dementia (HCC) 06/01/2018  . Senile osteoporosis 11/20/2017  . Healthcare maintenance 10/07/2017  . Prediabetes 04/06/2017  . Chest pain 11/11/2015  . Palpitations 11/11/2015  . Primary osteoarthritis of right hand 12/22/2014  . Bilateral cataracts 06/12/2014  . Heme positive stool 12/30/2013  . Heart block, first degree 05/20/2013  . Allergic rhinitis 10/21/2010  . Colonic polyp 10/21/2010  . Hypertension 10/21/2010  . LVH (left ventricular hypertrophy) due to hypertensive disease 10/21/2010  . Mixed hyperlipidemia 10/21/2010    Past Surgical History:  Procedure Laterality Date  . ABDOMINAL HYSTERECTOMY     partial due to prolapse  . BACK SURGERY  2007  . CATARACT EXTRACTION Left 06/26/2014  . CHOLECYSTECTOMY    . melanoma resection Right 1980     OB History   No obstetric history on file.     Family History  Problem Relation Age of Onset  . Cancer Mother   . Dementia Father        Natural Death  . Hodgkin's lymphoma Son   . Melanoma Son        Seizures  Social History   Tobacco Use  . Smoking status: Former Games developermoker  . Smokeless tobacco: Never Used  Vaping Use  . Vaping Use: Never used  Substance Use Topics  . Alcohol use: Not Currently  . Drug use: Never    Home Medications Prior to Admission medications   Medication Sig Start Date End Date Taking? Authorizing Provider  Acetaminophen (ACETAMINOPHEN EXTRA STRENGTH) 167 MG/5ML LIQD Take 30 mLs (1,002 mg total) by mouth 2 (two) times daily. 12/10/20   Sharon SellerEubanks, Jessica K, NP  apixaban (ELIQUIS) 5 MG TABS tablet Take 1 tablet (5 mg total) by mouth 2 (two) times daily. 09/20/20   Reed, Tiffany  L, DO  cephALEXin (KEFLEX) 500 MG capsule Take 1 capsule (500 mg total) by mouth 2 (two) times daily. 12/03/20   Little, Ambrose Finlandachel Morgan, MD  cyanocobalamin 1000 MCG tablet Take 1,000 mcg by mouth in the morning.    [provider]  donepezil (ARICEPT) 10 MG tablet TAKE 1 TABLET EVERY EVENING 07/04/20   Reed, Tiffany L, DO  LORazepam (ATIVAN) 0.5 MG tablet TAKE 1 TABLET DAILY AS NEEDED FOR ANXIETY 09/20/20   Reed, Tiffany L, DO  losartan (COZAAR) 50 MG tablet Take 50 mg by mouth in the morning and at bedtime.    [provider]  memantine (NAMENDA) 5 MG tablet Take 1 tablet (5 mg total) by mouth 2 (two) times daily. 07/05/20   Reed, Tiffany L, DO  nystatin cream (MYCOSTATIN) Apply 1 application topically 4 (four) times daily as needed (abdominal skin fold rash).    [provider]  QUEtiapine (SEROQUEL) 25 MG tablet Take 1 tablet (25 mg total) by mouth at bedtime. 12/11/20   Sharon SellerEubanks, Jessica K, NP  rosuvastatin (CRESTOR) 20 MG tablet Take 1 tablet (20 mg total) by mouth daily. 07/05/20   Reed, Tiffany L, DO    Allergies    Chamomile flowers [chamomile], Lisinopril, Mixed ragweed, and Other  Review of Systems   Review of Systems  Unable to perform ROS: Dementia    Physical Exam Updated Vital Signs BP 109/76   Pulse 100   Temp 97.7 F (36.5 C) (Oral)   Resp 18   SpO2 98%   Physical Exam Vitals and nursing note reviewed.  Constitutional:      Appearance: She is well-developed. She is ill-appearing.  HENT:     Head: Normocephalic and atraumatic.     Right Ear: External ear normal.     Left Ear: External ear normal.  Eyes:     General:        Right eye: No discharge.        Left eye: No discharge.  Cardiovascular:     Rate and Rhythm: Normal rate and regular rhythm.  Pulmonary:     Effort: Pulmonary effort is normal.     Breath sounds: Normal breath sounds. No wheezing or rhonchi.  Abdominal:     Palpations: Abdomen is soft.     Tenderness: There is no  abdominal tenderness.  Musculoskeletal:     Cervical back: Neck supple.     Right lower leg: No edema.     Left lower leg: No edema.  Skin:    General: Skin is warm and dry.     Capillary Refill: Capillary refill takes 2 to 3 seconds.  Neurological:     Mental Status: She is alert.     Comments: Patient not moving legs on examination Strong and equal grip strength bilaterally Equal smile Equal facial muscle  strength Unable to test coordination Examination limited by patient participation     ED Results / Procedures / Treatments   Labs (all labs ordered are listed, but only abnormal results are displayed) Labs Reviewed  COMPREHENSIVE METABOLIC PANEL - Abnormal; Notable for the following components:      Result Value   Glucose, Bld 103 (*)    Total Protein 6.2 (*)    All other components within normal limits  URINE CULTURE  CBC WITH DIFFERENTIAL/PLATELET  URINALYSIS, ROUTINE W REFLEX MICROSCOPIC  TSH    EKG None  Radiology CT Head Wo Contrast  Result Date: 12/13/2020 CLINICAL DATA:  Lethargy EXAM: CT HEAD WITHOUT CONTRAST TECHNIQUE: Contiguous axial images were obtained from the base of the skull through the vertex without intravenous contrast. COMPARISON:  CT brain 12/04/2019 FINDINGS: Brain: No acute territorial infarction, hemorrhage or intracranial mass. Moderate atrophy. Moderate hypodensity in the white matter consistent with chronic small vessel ischemic change. Stable ventricle size. Vascular: No hyperdense vessels.  No unexpected calcification. Skull: Normal. Negative for fracture or focal lesion. Sinuses/Orbits: No acute finding. Other: None IMPRESSION: 1. No CT evidence for acute intracranial abnormality. 2. Atrophy and chronic small vessel ischemic changes of the white matter. Electronically Signed   By: Jasmine Pang M.D.   On: 12/13/2020 19:35   DG Chest Portable 1 View  Result Date: 12/13/2020 CLINICAL DATA:  Altered level of consciousness EXAM: PORTABLE CHEST  1 VIEW COMPARISON:  None. FINDINGS: Single frontal view of the chest demonstrates borderline enlargement the cardiac silhouette. Dense calcification of the mitral annulus. No airspace disease, effusion, or pneumothorax. No acute bony abnormalities. IMPRESSION: 1. No acute intrathoracic process. Electronically Signed   By: Sharlet Salina M.D.   On: 12/13/2020 18:47    Procedures Procedures   Medications Ordered in ED Medications  lactated ringers bolus 500 mL (0 mLs Intravenous Stopped 12/13/20 2151)  QUEtiapine (SEROQUEL) tablet 25 mg (25 mg Oral Given 12/13/20 2211)    ED Course  I have reviewed the triage vital signs and the nursing notes.  Pertinent labs & imaging results that were available during my care of the patient were reviewed by me and considered in my medical decision making (see chart for details).    MDM Rules/Calculators/A&P                          86 year old female who presents with concerns for fatigue that has been progressively worsening over the past few weeks.  Patient's daughter at bedside reporting lethargy today.  This is worse than normal.  Per further chart review, patient has been slowly declining over the past few months.  While possible patient has and acute cause for her increased fatigue today, patient appears close to her baseline - she is not communicating, she is having trouble with her memory, and she is irritable.  Also possible patient's presentation is secondary to patient's natural decline in mental status due to her dementia.   She is afebrile, with a normal blood pressure and normal pulse.  She is saturating 98% on room air.  And on further evaluation patient is moving all extremities freely without any focal deficit and therefore I am less concerned for more central cause of her mental status and lethargy.  CT head ordered and chest x-ray ordered looking for intracranial lesions and possible pneumonia.  No acute abnormalities identified.  TSH,  CBC, and CMP all unremarkable.  Patient's recent history of urinary tract infection,  we will check urine to ensure that she does not have a persistent infection after already being treated with Keflex.  After further discussion with daughter at bedside, patient does appear more alert to her than before, however she is still concerned about her lethargy earlier in the day. Discussed possible etiologies including the possibility of UTI. Daughter is also concerned about her functional status. Reports that patient is working with physical therapy and occupational therapy at facility which started today. Acknowledged this is excellent for her rehabilitation but also a possible cause of fatigue if she worked hard today. Plan is to follow up urine results. If normal, patient to return back to facility to work with PT and OT. If abnormal will admit for antibiotics as she has failed outpatient treatment and possibly affecting mental status. Daughter and patient in agreement with plan.   UA negative for infection. Patient stable for discharge home. Discussed results with patient and daughter at bedside. Strict return precautions provided.   Final Clinical Impression(s) / ED Diagnoses Final diagnoses:  Fatigue, unspecified type  History of dementia    Rx / DC Orders ED Discharge Orders    None       Joseguadalupe Stan, [place], MD 12/14/20 0109    Tegeler, Canary Brim, MD 12/14/20 1511

## 2020-12-13 NOTE — ED Notes (Signed)
Privder aware pt has not voided and bladder scan shows .

## 2020-12-13 NOTE — ED Notes (Signed)
Pt noted to be more restless and moving about in bed, able to be redirected. Daughter @ bedside. MD made aware. Bed low and locked. Side rails up x2. Call bell I reach.

## 2020-12-14 ENCOUNTER — Telehealth: Payer: Self-pay

## 2020-12-14 DIAGNOSIS — R5383 Other fatigue: Secondary | ICD-10-CM | POA: Diagnosis not present

## 2020-12-14 LAB — URINALYSIS, ROUTINE W REFLEX MICROSCOPIC
Bilirubin Urine: NEGATIVE
Glucose, UA: NEGATIVE mg/dL
Hgb urine dipstick: NEGATIVE
Ketones, ur: NEGATIVE mg/dL
Leukocytes,Ua: NEGATIVE
Nitrite: NEGATIVE
Protein, ur: NEGATIVE mg/dL
Specific Gravity, Urine: 1.013 (ref 1.005–1.030)
pH: 5 (ref 5.0–8.0)

## 2020-12-14 NOTE — ED Notes (Signed)
Patient verbalizes understanding of discharge instructions. Opportunity for questioning and answers were provided. Armband removed by staff, pt discharged from ED via [facility].  

## 2020-12-14 NOTE — ED Notes (Signed)
Report called to [facility] @ [facility], Denied questions or concerns regarding report.

## 2020-12-14 NOTE — Telephone Encounter (Signed)
Any infection takes a toll on the elderly esp when they have dementia. She may just be taking longer to "bounce back." would recommend continuing with supportive care, trying to encourage hydration and nutritional intake. Her protein levels were low on labs from ED but overall lab work and imaging was unremarkable for changes.

## 2020-12-14 NOTE — ED Notes (Signed)
[facility] called  

## 2020-12-14 NOTE — ED Notes (Signed)
ED Provider at bedside. 

## 2020-12-14 NOTE — ED Notes (Signed)
Unit secretary to contact [facility] for transport.  

## 2020-12-14 NOTE — Telephone Encounter (Signed)
Patients daughter called stating she would like for [name] to review the ER notes from yesterday, reference the visit they had with her this week, and share her thoughts. [name] states she is so confused as to what is going on with her mother. At the time of call patients was wide awake and fine as if nothing had happened yesterday.  [name] states she doesn't know what to do

## 2020-12-14 NOTE — ED Notes (Signed)
Report attempted to [facility]. RN was told would try to reach staff and call back to receive report.

## 2020-12-14 NOTE — Discharge Instructions (Signed)
-   Your labs and urine were normal and reassuring - Your CT head did not show any abnormalities - You should continue to work with physical therapy at your facility, this will increase your strength - Please return to the emergency department if you have another sudden decrease in mental status

## 2020-12-14 NOTE — ED Notes (Signed)
Report given to Caryn Bee, RN by Morrie Sheldon, RN

## 2020-12-14 NOTE — ED Notes (Signed)
Patient and daughter made aware transport has been contacted, awaiting same. Patient moved to yellow zone to wait for transport.

## 2020-12-14 NOTE — ED Notes (Signed)
RN straight cathed pt for urine after after bladder scan. tolerated well sent to lab.

## 2020-12-14 NOTE — Telephone Encounter (Signed)
Spoke with [name] and relayed Jessica's reply. [name] verbalized understanding

## 2020-12-14 NOTE — ED Notes (Signed)
Pt resting in bed @ this time w/ NAD noted. VSS. Cardiac monitoring in place w/ bp cycling and spo2 being monitored. Updated on plan of care. Deny needs or concerns @ this time. Bed low and locked. 

## 2020-12-15 ENCOUNTER — Emergency Department (HOSPITAL_COMMUNITY)
Admission: EM | Admit: 2020-12-15 | Discharge: 2020-12-15 | Disposition: A | Discharge status: Home / Self Care | Payer: Medicare Other | Attending: Emergency Medicine | Admitting: Emergency Medicine

## 2020-12-15 ENCOUNTER — Emergency Department (HOSPITAL_COMMUNITY): Payer: Medicare Other

## 2020-12-15 DIAGNOSIS — F0390 Unspecified dementia without behavioral disturbance: Secondary | ICD-10-CM | POA: Diagnosis not present

## 2020-12-15 DIAGNOSIS — Z87891 Personal history of nicotine dependence: Secondary | ICD-10-CM | POA: Diagnosis not present

## 2020-12-15 DIAGNOSIS — I10 Essential (primary) hypertension: Secondary | ICD-10-CM | POA: Insufficient documentation

## 2020-12-15 DIAGNOSIS — E119 Type 2 diabetes mellitus without complications: Secondary | ICD-10-CM | POA: Insufficient documentation

## 2020-12-15 DIAGNOSIS — Z79899 Other long term (current) drug therapy: Secondary | ICD-10-CM | POA: Insufficient documentation

## 2020-12-15 DIAGNOSIS — Z7901 Long term (current) use of anticoagulants: Secondary | ICD-10-CM | POA: Diagnosis not present

## 2020-12-15 DIAGNOSIS — W19XXXA Unspecified fall, initial encounter: Secondary | ICD-10-CM | POA: Diagnosis not present

## 2020-12-15 LAB — URINE CULTURE: Culture: NO GROWTH

## 2020-12-15 NOTE — ED Provider Notes (Signed)
[facility] EMERGENCY DEPARTMENT Provider Note   CSN: [id number] Arrival date & time: 12/15/20  1956     History Chief Complaint  Patient presents with  . Fall    Stacey Dalton is a 86 y.o. female.  Patient sent to the ER for report of an unwitnessed fall.  Unable to obtain history from the patient himself as she has severe dementia.  When asked if she had a fall she just shrugs her shoulders.  If I asked if anything hurts her she states no.  Unable to reach her family has the listed emergency contact phone number just goes to voicemail.  Reportedly on a anticoagulant.        Past Medical History:  Diagnosis Date  . A-fib (HCC)   . Allergic rhinitis 10/21/2010  . Back pain 10/21/2010  . Chronic back pain 10/21/2010  . Colonic polyp 10/21/2018  . Dementia (HCC)   . Diabetes mellitus without complication (HCC)   . Echocardiogram abnormal 11/09/2018  . Epigastric abdominal pain   . History of recent travel   . Hyperlipidemia   . Hypertension   . Incontinence   . Mixed hyperlipidemia   . S/P cholecystectomy 10/21/2010    Patient Active Problem List   Diagnosis Date Noted  . Overactive bladder 02/06/2020  . Fall 12/22/2019  . SAH (subarachnoid hemorrhage) (HCC) 12/09/2019  . Leg cramps 04/11/2019  . Urinary incontinence 04/11/2019  . Atrial fibrillation (HCC) 10/12/2018  . Right-sided low back pain without sciatica 10/11/2018  . Dementia (HCC) 06/01/2018  . Senile osteoporosis 11/20/2017  . Healthcare maintenance 10/07/2017  . Prediabetes 04/06/2017  . Chest pain 11/11/2015  . Palpitations 11/11/2015  . Primary osteoarthritis of right hand 12/22/2014  . Bilateral cataracts 06/12/2014  . Heme positive stool 12/30/2013  . Heart block, first degree 05/20/2013  . Allergic rhinitis 10/21/2010  . Colonic polyp 10/21/2010  . Hypertension 10/21/2010  . LVH (left ventricular hypertrophy) due to hypertensive disease 10/21/2010  . Mixed  hyperlipidemia 10/21/2010    Past Surgical History:  Procedure Laterality Date  . ABDOMINAL HYSTERECTOMY     partial due to prolapse  . BACK SURGERY  2007  . CATARACT EXTRACTION Left 06/26/2014  . CHOLECYSTECTOMY    . melanoma resection Right 1980     OB History   No obstetric history on file.     Family History  Problem Relation Age of Onset  . Cancer Mother   . Dementia Father        Natural Death  . Hodgkin's lymphoma Son   . Melanoma Son        Seizures    Social History   Tobacco Use  . Smoking status: Former Games developer  . Smokeless tobacco: Never Used  Vaping Use  . Vaping Use: Never used  Substance Use Topics  . Alcohol use: Not Currently  . Drug use: Never    Home Medications Prior to Admission medications   Medication Sig Start Date End Date Taking? Authorizing Provider  Acetaminophen (ACETAMINOPHEN EXTRA STRENGTH) 167 MG/5ML LIQD Take 30 mLs (1,002 mg total) by mouth 2 (two) times daily. 12/10/20   Sharon Seller, NP  apixaban (ELIQUIS) 5 MG TABS tablet Take 1 tablet (5 mg total) by mouth 2 (two) times daily. 09/20/20   Reed, Tiffany L, DO  cephALEXin (KEFLEX) 500 MG capsule Take 1 capsule (500 mg total) by mouth 2 (two) times daily. 12/03/20   Little, Ambrose Finland, MD  cyanocobalamin 1000 MCG tablet  Take 1,000 mcg by mouth in the morning.    [provider]  donepezil (ARICEPT) 10 MG tablet TAKE 1 TABLET EVERY EVENING 07/04/20   Reed, Tiffany L, DO  LORazepam (ATIVAN) 0.5 MG tablet TAKE 1 TABLET DAILY AS NEEDED FOR ANXIETY 09/20/20   Reed, Tiffany L, DO  losartan (COZAAR) 50 MG tablet Take 50 mg by mouth in the morning and at bedtime.    [provider]  memantine (NAMENDA) 5 MG tablet Take 1 tablet (5 mg total) by mouth 2 (two) times daily. 07/05/20   Reed, Tiffany L, DO  nystatin cream (MYCOSTATIN) Apply 1 application topically 4 (four) times daily as needed (abdominal skin fold rash).    [provider]  QUEtiapine (SEROQUEL) 25  MG tablet Take 1 tablet (25 mg total) by mouth at bedtime. 12/11/20   Sharon Seller, NP  rosuvastatin (CRESTOR) 20 MG tablet Take 1 tablet (20 mg total) by mouth daily. 07/05/20   Reed, Tiffany L, DO    Allergies    Chamomile flowers [chamomile], Lisinopril, Mixed ragweed, and Other  Review of Systems   Review of Systems  Unable to perform ROS: Dementia    Physical Exam Updated Vital Signs BP 135/84 (BP Location: Right Arm)   Pulse 92   Temp (!) 97.5 F (36.4 C) (Oral)   Ht 5\' 3"  (1.6 m)   Wt 85 kg   SpO2 98%   BMI 33.19 kg/m   Physical Exam Constitutional:      General: She is not in acute distress.    Appearance: Normal appearance.  HENT:     Head: Normocephalic and atraumatic.     Nose: Nose normal.  Eyes:     Extraocular Movements: Extraocular movements intact.  Cardiovascular:     Rate and Rhythm: Normal rate.  Pulmonary:     Effort: Pulmonary effort is normal.  Musculoskeletal:        General: Normal range of motion.     Cervical back: Normal range of motion.     Comments: No C or T or L-spine step-offs or tenderness noted.  No rib pain or tenderness noted.  Patient appears to be moving bilateral upper and lower extremities without pain or discomfort.  Neurological:     General: No focal deficit present.     Mental Status: She is alert. Mental status is at baseline.     Comments: Patient moves all extremities to command.  She is awake and alert appears in no distress.  No evidence of trauma visible.     ED Results / Procedures / Treatments   Labs (all labs ordered are listed, but only abnormal results are displayed) Labs Reviewed - No data to display  EKG EKG Interpretation  Date/Time:  Saturday December 15 2020 19:58:09 EDT Ventricular Rate:  94 PR Interval:    QRS Duration: 94 QT Interval:  385 QTC Calculation: 482 R Axis:   -36 Text Interpretation: Atrial fibrillation Left axis deviation Confirmed by [id number] ([id number]) on 12/15/2020 8:19:09  PM   Radiology CT Head Wo Contrast  Result Date: 12/15/2020 CLINICAL DATA:  Unwitnessed fall EXAM: CT HEAD WITHOUT CONTRAST TECHNIQUE: Contiguous axial images were obtained from the base of the skull through the vertex without intravenous contrast. COMPARISON:  12/13/2020 FINDINGS: Brain: There is atrophy and chronic small vessel disease changes. No acute intracranial abnormality. Specifically, no hemorrhage, hydrocephalus, mass lesion, acute infarction, or significant intracranial injury. Vascular: No hyperdense vessel or unexpected calcification. Skull: No acute calvarial  abnormality. Sinuses/Orbits: No acute findings Other: None IMPRESSION: Atrophy, chronic microvascular disease. No acute intracranial abnormality. Electronically Signed   By: Charlett Nose M.D.   On: 12/15/2020 20:39    Procedures Procedures   Medications Ordered in ED Medications - No data to display  ED Course  I have reviewed the triage vital signs and the nursing notes.  Pertinent labs & imaging results that were available during my care of the patient were reviewed by me and considered in my medical decision making (see chart for details).    MDM Rules/Calculators/A&P                          CT imaging of the brain performed showing no acute findings.  I have left a message with the family emergency contact as I was unable to reach them by phone.  Patient otherwise discharged back home, advised return if she has any additional concerns.  Final Clinical Impression(s) / ED Diagnoses Final diagnoses:  Fall, initial encounter    Rx / DC Orders ED Discharge Orders    None       Cheryll Cockayne, MD 12/15/20 2112

## 2020-12-15 NOTE — Discharge Instructions (Addendum)
Call your primary care doctor or specialist as discussed in the next 2-3 days.   Return immediately back to the ER if:  Your symptoms worsen within the next 12-24 hours. You develop new symptoms such as new fevers, persistent vomiting, new pain, shortness of breath, or new weakness or numbness, or if you have any other concerns.  

## 2020-12-15 NOTE — ED Triage Notes (Signed)
Pt had an unwitnessed fall; denies any pain. Currently taking Eliquis

## 2021-01-01 ENCOUNTER — Other Ambulatory Visit: Payer: Self-pay | Admitting: Internal Medicine

## 2021-01-06 ENCOUNTER — Emergency Department (HOSPITAL_COMMUNITY): Payer: Medicare Other

## 2021-01-06 ENCOUNTER — Encounter (HOSPITAL_COMMUNITY): Payer: Self-pay | Admitting: Emergency Medicine

## 2021-01-06 ENCOUNTER — Emergency Department (HOSPITAL_COMMUNITY)
Admission: EM | Admit: 2021-01-06 | Discharge: 2021-01-06 | Disposition: A | Discharge status: Home / Self Care | Payer: Medicare Other | Attending: Emergency Medicine | Admitting: Emergency Medicine

## 2021-01-06 DIAGNOSIS — W182XXA Fall in (into) shower or empty bathtub, initial encounter: Secondary | ICD-10-CM | POA: Diagnosis not present

## 2021-01-06 DIAGNOSIS — W19XXXA Unspecified fall, initial encounter: Secondary | ICD-10-CM

## 2021-01-06 DIAGNOSIS — S0083XA Contusion of other part of head, initial encounter: Secondary | ICD-10-CM | POA: Diagnosis not present

## 2021-01-06 DIAGNOSIS — F0390 Unspecified dementia without behavioral disturbance: Secondary | ICD-10-CM | POA: Diagnosis not present

## 2021-01-06 DIAGNOSIS — S0990XA Unspecified injury of head, initial encounter: Secondary | ICD-10-CM | POA: Diagnosis present

## 2021-01-06 DIAGNOSIS — Y92002 Bathroom of unspecified non-institutional (private) residence single-family (private) house as the place of occurrence of the external cause: Secondary | ICD-10-CM | POA: Diagnosis not present

## 2021-01-06 NOTE — ED Notes (Signed)
[facility] called  

## 2021-01-06 NOTE — ED Triage Notes (Addendum)
Pt arrived by EMS from [facility] facility. Pt had unwitnessed fall in bathroom. Hematoma on back of head. Pt takes eliquis   Hx of dementia, per medics staff report that pt does not communicate verbally and has been more restless the past few days

## 2021-01-06 NOTE — ED Notes (Signed)
Staff at [facility] notified that pt is being discharged and brought back to facilty by [facility]

## 2021-01-06 NOTE — ED Provider Notes (Signed)
[facility] EMERGENCY DEPARTMENT Provider Note   CSN: [id number] Arrival date & time: 01/06/21  0515     History Chief Complaint  Patient presents with  . Fall    Stacey Dalton is a 86 y.o. female.  The history is limited by the condition of the patient (level 5 caveat dementia ).  Fall This is a new problem. The current episode started less than 1 hour ago. The problem occurs constantly. The problem has not changed since onset.Pertinent negatives include no shortness of breath. Nothing aggravates the symptoms. Nothing relieves the symptoms. She has tried nothing for the symptoms. The treatment provided no relief.  Unwitnessed fall at nursing home on blood thinners.       History reviewed. No pertinent past medical history.  There are no problems to display for this patient.   History reviewed. No pertinent surgical history.   OB History   No obstetric history on file.     History reviewed. No pertinent family history.     Home Medications Prior to Admission medications   Not on File    Allergies    Patient has no known allergies.  Review of Systems   Review of Systems  Unable to perform ROS: Dementia  Constitutional: Negative for fever.  HENT: Negative for drooling.   Respiratory: Negative for shortness of breath.   Cardiovascular: Negative for leg swelling.  Gastrointestinal: Negative for vomiting.  Skin: Negative for wound.  Neurological: Negative for facial asymmetry.  Psychiatric/Behavioral: Negative for agitation.    Physical Exam Updated Vital Signs BP 112/79   Pulse 88   Temp 99 F (37.2 C) (Axillary)   Resp 20   SpO2 96%   Physical Exam Vitals and nursing note reviewed.  Constitutional:      Appearance: Normal appearance. She is not diaphoretic.  HENT:     Head: Normocephalic and atraumatic.     Nose: Nose normal.  Eyes:     Conjunctiva/sclera: Conjunctivae normal.     Pupils: Pupils are equal, round, and  reactive to light.  Cardiovascular:     Rate and Rhythm: Normal rate and regular rhythm.     Pulses: Normal pulses.     Heart sounds: Normal heart sounds.  Pulmonary:     Effort: Pulmonary effort is normal.     Breath sounds: Normal breath sounds.  Abdominal:     General: Abdomen is flat. Bowel sounds are normal.     Palpations: Abdomen is soft.     Tenderness: There is no abdominal tenderness. There is no guarding.  Musculoskeletal:        General: No tenderness or deformity. Normal range of motion.     Right shoulder: Normal.     Left shoulder: Normal.     Cervical back: Normal, normal range of motion and neck supple.     Thoracic back: Normal.     Lumbar back: Normal.     Right hip: Normal.     Left hip: Normal.     Right upper leg: Normal.     Left upper leg: Normal.     Right knee: Normal.     Left knee: Normal.     Right ankle: Normal.     Right Achilles Tendon: Normal.     Left ankle: Normal.     Left Achilles Tendon: Normal.     Right foot: Normal.     Left foot: Normal.  Skin:    General: Skin is warm and dry.  Capillary Refill: Capillary refill takes less than 2 seconds.  Neurological:     Mental Status: She is alert.     Deep Tendon Reflexes: Reflexes normal.     ED Results / Procedures / Treatments   Labs (all labs ordered are listed, but only abnormal results are displayed) Labs Reviewed - No data to display  EKG None  Radiology CT Head Wo Contrast  Result Date: 01/06/2021 CLINICAL DATA:  Fall with facial trauma.  Head and neck pain EXAM: CT HEAD WITHOUT CONTRAST CT CERVICAL SPINE WITHOUT CONTRAST TECHNIQUE: Multidetector CT imaging of the head and cervical spine was performed following the standard protocol without intravenous contrast. Multiplanar CT image reconstructions of the cervical spine were also generated. COMPARISON:  12/15/2020 head CT FINDINGS: CT HEAD FINDINGS Brain: No evidence of acute infarction, hemorrhage, hydrocephalus,  extra-axial collection or mass lesion/mass effect. Generalized atrophy and confluent chronic small vessel ischemia in the white matter Vascular: No hyperdense vessel or unexpected calcification. Skull: Negative for fracture Sinuses/Orbits: No visible injury CT CERVICAL SPINE FINDINGS Alignment: Normal. Skull base and vertebrae: No acute fracture. No primary bone lesion or focal pathologic process. Soft tissues and spinal canal: No prevertebral fluid or swelling. No visible canal hematoma. 14 mm right thyroid nodule. No followup recommended (ref: J Am Coll Radiol. 2015 Feb;12(2): 143-50). Disc levels:  Ordinary degenerative changes. Upper chest: Negative IMPRESSION: No evidence of acute intracranial or cervical spine injury. Electronically Signed   By: Marnee Spring M.D.   On: 01/06/2021 06:14   CT Cervical Spine Wo Contrast  Result Date: 01/06/2021 CLINICAL DATA:  Fall with facial trauma.  Head and neck pain EXAM: CT HEAD WITHOUT CONTRAST CT CERVICAL SPINE WITHOUT CONTRAST TECHNIQUE: Multidetector CT imaging of the head and cervical spine was performed following the standard protocol without intravenous contrast. Multiplanar CT image reconstructions of the cervical spine were also generated. COMPARISON:  12/15/2020 head CT FINDINGS: CT HEAD FINDINGS Brain: No evidence of acute infarction, hemorrhage, hydrocephalus, extra-axial collection or mass lesion/mass effect. Generalized atrophy and confluent chronic small vessel ischemia in the white matter Vascular: No hyperdense vessel or unexpected calcification. Skull: Negative for fracture Sinuses/Orbits: No visible injury CT CERVICAL SPINE FINDINGS Alignment: Normal. Skull base and vertebrae: No acute fracture. No primary bone lesion or focal pathologic process. Soft tissues and spinal canal: No prevertebral fluid or swelling. No visible canal hematoma. 14 mm right thyroid nodule. No followup recommended (ref: J Am Coll Radiol. 2015 Feb;12(2): 143-50). Disc  levels:  Ordinary degenerative changes. Upper chest: Negative IMPRESSION: No evidence of acute intracranial or cervical spine injury. Electronically Signed   By: Marnee Spring M.D.   On: 01/06/2021 06:14   DG Chest Portable 1 View  Result Date: 01/06/2021 CLINICAL DATA:  Fall with trauma EXAM: PORTABLE CHEST 1 VIEW COMPARISON:  12/13/2020 FINDINGS: Enlarged heart size. Stable mediastinal contours. Mitral annular calcification. No acute infiltrate or edema. No effusion or pneumothorax. Rounded calcification over the left base. No acute osseous findings. Degenerative endplate spurring. Artifact from EKG leads. IMPRESSION: Stable from prior.  No acute finding. Electronically Signed   By: Marnee Spring M.D.   On: 01/06/2021 05:45    Procedures Procedures   Medications Ordered in ED Medications - No data to display  ED Course  I have reviewed the triage vital signs and the nursing notes.  Pertinent labs & imaging results that were available during my care of the patient were reviewed by me and considered in my medical decision making (see  chart for details).    No acute trauma from the fall.    Stacey Dalton was evaluated in Emergency Department on 01/06/2021 for the symptoms described in the history of present illness. She was evaluated in the context of the global COVID-19 pandemic, which necessitated consideration that the patient might be at risk for infection with the SARS-CoV-2 virus that causes COVID-19. Institutional protocols and algorithms that pertain to the evaluation of patients at risk for COVID-19 are in a state of rapid change based on information released by regulatory bodies including the CDC and federal and state organizations. These policies and algorithms were followed during the patient's care in the ED. Final Clinical Impression(s) / ED Diagnoses Return for intractable cough, coughing up blood, fevers >100.4 unrelieved by medication, shortness of breath, intractable  vomiting, chest pain, shortness of breath, weakness, numbness, changes in speech, facial asymmetry, abdominal pain, passing out, Inability to tolerate liquids or food, cough, altered mental status or any concerns. No signs of systemic illness or infection. The patient is nontoxic-appearing on exam and vital signs are within normal limits.  I have reviewed the triage vital signs and the nursing notes. Pertinent labs & imaging results that were available during my care of the patient were reviewed by me and considered in my medical decision making (see chart for details). After history, exam, and medical workup I feel the patient has been appropriately medically screened and is safe for discharge home. Pertinent diagnoses were discussed with the patient. Patient was given return precautions.    Haunani Dickard, MD 01/06/21 [phone]

## 2021-01-07 ENCOUNTER — Encounter (HOSPITAL_COMMUNITY): Payer: Self-pay

## 2021-01-11 ENCOUNTER — Telehealth: Payer: Self-pay | Admitting: *Deleted

## 2021-01-11 NOTE — Telephone Encounter (Signed)
Stacey Dalton, daughter called and stated that patient is getting PT through [facility]. Stated that they have been talking about getting an order for Wheelchair and Bed alarm for patient due to frequent falls. Was recently in ER.  Stated that she is going to have the PT dept at [facility] fax over an assessment and order for Provider.   FYI

## 2021-01-14 NOTE — Telephone Encounter (Signed)
Stacey Dalton with [name] called requesting last OV notes to be faxed to cover the Wheelchair ordered.  Needs notes faxed to [phone] Faxed.

## 2021-01-23 ENCOUNTER — Telehealth: Payer: Self-pay | Admitting: *Deleted

## 2021-01-23 NOTE — Telephone Encounter (Signed)
Received fax from [employer] at [place] [facility] [place] [phone] Fax: [phone] Requesting a new order for Wheelchair to be signed. (Original oder has expired)  Also needing details to be included in last OV note. Needs appropriate Narrative for Insurance to cover the Standard Wheelchair.   Paperwork placed in Stacey Dalton's folder to review for documentation.

## 2021-01-24 NOTE — Telephone Encounter (Signed)
[name], daughter, Notified and an Appointment set up for next Tuesday.

## 2021-01-24 NOTE — Telephone Encounter (Signed)
Lets have them set up an office visit- can be video and I can add the narrative to the note since it has been several months since she has been seen.

## 2021-01-25 NOTE — Telephone Encounter (Signed)
Paperwork for Wheelchair from [employer] by [employer]. Given Paperwork to Evie to hold till patient's appointment on 01/29/2021.

## 2021-01-29 ENCOUNTER — Telehealth: Payer: Self-pay

## 2021-01-29 ENCOUNTER — Telehealth (INDEPENDENT_AMBULATORY_CARE_PROVIDER_SITE_OTHER): Payer: Medicare Other | Admitting: Nurse Practitioner

## 2021-01-29 ENCOUNTER — Other Ambulatory Visit: Payer: Self-pay

## 2021-01-29 DIAGNOSIS — R296 Repeated falls: Secondary | ICD-10-CM

## 2021-01-29 DIAGNOSIS — G301 Alzheimer's disease with late onset: Secondary | ICD-10-CM

## 2021-01-29 DIAGNOSIS — F0281 Dementia in other diseases classified elsewhere with behavioral disturbance: Secondary | ICD-10-CM

## 2021-01-29 DIAGNOSIS — G8929 Other chronic pain: Secondary | ICD-10-CM

## 2021-01-29 DIAGNOSIS — R2681 Unsteadiness on feet: Secondary | ICD-10-CM

## 2021-01-29 DIAGNOSIS — M545 Low back pain, unspecified: Secondary | ICD-10-CM | POA: Diagnosis not present

## 2021-01-29 NOTE — Progress Notes (Signed)
This service is provided via telemedicine  No vital signs collected/recorded due to the encounter was a telemedicine visit.   Location of patient (ex: home, work): Home  Patient consents to a telephone visit: Yes, see encounter dated 01/29/2021  Location of the provider (ex: office, home): [facility]  Name of any referring provider: N/A  Names of all persons participating in the telemedicine service and their role in the encounter:  Abbey Chatters, Nurse Practitioner, Elveria Royals, CMA, patient and daughter, [name].  Time spent on call:  9 minutes with medical assistance

## 2021-01-29 NOTE — Progress Notes (Signed)
Careteam: Patient Care Team: Sharon Seller, NP as PCP - General (Geriatric Medicine) Sharon Seller, NP (Geriatric Medicine)  Advanced Directive information    Allergies  Allergen Reactions  . Chamomile Flowers [Chamomile]   . Lisinopril   . Mixed Ragweed   . Other     Seasonal allergies, cats    Chief Complaint  Patient presents with  . Acute Visit    Discuss need for wheelchair. Patient is using walker. Needs order for personal wheelchair per physical therapy at facility. They would rather she has own wheelchair instead of loaner from facility whenever she needs it.  Marland Kitchen Health Maintenance    Discuss need for DEXA scan and COVID 19 booster     HPI: Patient is a 86 y.o. female to discuss need for wheelchair.   Pt has a hx of falls started in March 22. She has fallen over 6 times and ended up in the ED. She has hx of a fib and on eliquis. Concerns over bleeding.  She has had a hx of UTI and become very shaky. When she becomes shaky she has the falls.  PT/OT recommended wheelchair to help prevent falls when she is shaky/having weakness. She tires easily.  She also uses walker to help increase mobility but can not sustain for a long period of time due to fatigue.  Daughter reports she can walk about 50 ft at one time before becoming fatigued and needing to sit down. She will then use the wheelchair. She lives in facility that helps her get around in wheelchair. Also working with her to self propel in the wheelchair.  They are trying to promote fluid intake.  She has some left knee pain and hx of chronic low back pain    Review of Systems:  Review of Systems  Unable to perform ROS: Dementia    Past Medical History:  Diagnosis Date  . A-fib (HCC)   . Allergic rhinitis 10/21/2010  . Back pain 10/21/2010  . Chronic back pain 10/21/2010  . Colonic polyp 10/21/2018  . Dementia (HCC)   . Diabetes mellitus without complication (HCC)   . Echocardiogram abnormal  11/09/2018  . Epigastric abdominal pain   . History of recent travel   . Hyperlipidemia   . Hypertension   . Incontinence   . Mixed hyperlipidemia   . S/P cholecystectomy 10/21/2010   Past Surgical History:  Procedure Laterality Date  . ABDOMINAL HYSTERECTOMY     partial due to prolapse  . BACK SURGERY  2007  . CATARACT EXTRACTION Left 06/26/2014  . CHOLECYSTECTOMY    . melanoma resection Right 1980   Social History:   reports that she has quit smoking. She has never used smokeless tobacco. She reports previous alcohol use. She reports that she does not use drugs.  Family History  Problem Relation Age of Onset  . Cancer Mother   . Dementia Father        Natural Death  . Hodgkin's lymphoma Son   . Melanoma Son        Seizures    Medications: Patient's Medications  New Prescriptions   No medications on file  Previous Medications   ACETAMINOPHEN (ACETAMINOPHEN EXTRA STRENGTH) 167 MG/5ML LIQD    Take 30 mLs (1,002 mg total) by mouth 2 (two) times daily.   APIXABAN (ELIQUIS) 5 MG TABS TABLET    Take 1 tablet (5 mg total) by mouth 2 (two) times daily.   CYANOCOBALAMIN 1000 MCG TABLET  Take 1,000 mcg by mouth in the morning.   DONEPEZIL (ARICEPT) 10 MG TABLET    TAKE 1 TABLET EVERY EVENING   LORAZEPAM (ATIVAN) 0.5 MG TABLET    TAKE 1 TABLET DAILY AS NEEDED FOR ANXIETY   LOSARTAN (COZAAR) 50 MG TABLET    TAKE 1 TABLET DAILY   MEMANTINE (NAMENDA) 5 MG TABLET    TAKE 1 TABLET TWICE A DAY   NYSTATIN CREAM (MYCOSTATIN)    Apply 1 application topically 4 (four) times daily as needed (abdominal skin fold rash).   QUETIAPINE (SEROQUEL) 25 MG TABLET    Take 1 tablet (25 mg total) by mouth at bedtime.   ROSUVASTATIN (CRESTOR) 20 MG TABLET    TAKE 1 TABLET DAILY  Modified Medications   No medications on file  Discontinued Medications   CEPHALEXIN (KEFLEX) 500 MG CAPSULE    Take 1 capsule (500 mg total) by mouth 2 (two) times daily.    Physical Exam:  There were no vitals filed  for this visit. There is no height or weight on file to calculate BMI. Wt Readings from Last 3 Encounters:  12/15/20 187 lb 6.3 oz (85 kg)  12/10/20 188 lb (85.3 kg)  10/18/20 191 lb 9.6 oz (86.9 kg)    Physical Exam Neurological:     Mental Status: She is alert. Mental status is at baseline.     Labs reviewed: Basic Metabolic Panel: Recent Labs    10/18/20 1621 12/10/20 1607 12/13/20 1829 12/13/20 1845  NA 142 141  --  138  K 4.5 4.3  --  3.9  CL 106 106  --  107  CO2 25 25  --  25  GLUCOSE 94 108  --  103*  BUN 20 17  --  17  CREATININE 0.77 0.72  --  0.74  CALCIUM 9.3 9.1  --  9.0  TSH 2.65  --  2.173  --    Liver Function Tests: Recent Labs    10/18/20 1621 12/10/20 1607 12/13/20 1845  AST 13 16 18   ALT 13 21 21   ALKPHOS  --   --  45  BILITOT 0.5 0.7 0.7  PROT 6.3 6.5 6.2*  ALBUMIN  --   --  3.7   No results for input(s): LIPASE, AMYLASE in the last 8760 hours. No results for input(s): AMMONIA in the last 8760 hours. CBC: Recent Labs    10/18/20 1621 12/10/20 1607 12/13/20 1845  WBC 8.0 8.4 8.3  NEUTROABS 5,224 5,233 5.0  HGB 13.8 14.4 14.7  HCT 41.1 42.6 45.3  MCV 91.5 90.8 94.0  PLT 222 260 246   Lipid Panel: Recent Labs    06/07/20 1604  CHOL 124  HDL 38*  LDLCALC 62  TRIG 12/15/20*  CHOLHDL 3.3   TSH: Recent Labs    10/18/20 1621 12/13/20 1829  TSH 2.65 2.173   A1C: Lab Results  Component Value Date   HGBA1C 5.9 10/04/2018     Assessment/Plan 1. Unsteady gait With muscle weakness and repeated falls would benefit from wheelchair with leg rest, seat cushion and back cushion to help promote independence and mobility.   2. Frequent falls Continues with PT. Would benefit from having wheelchair so she is able to sit down when she becomes weak and unable to ambulate with her walker.  -DME for wheelchair needed   3. Late onset Alzheimer's disease with behavioral disturbance (HCC) -progressive decline. Would benefit from  wheelchair to help promote mobility and decrease fall with  injury.   4. Chronic right-sided low back pain without sciatica Stable at this time.  Janene Harvey. Biagio Borg  [facility] [phone]    Virtual Visit via Earleen Reaper  I connected with patient on 01/29/21 at  3:45 PM EDT by video and verified that I am speaking with the correct person using two identifiers.  Location: Patient: home Provider: [facility].    I discussed the limitations, risks, security and privacy concerns of performing an evaluation and management service by telephone and the availability of in person appointments. I also discussed with the patient that there may be a patient responsible charge related to this service. The patient expressed understanding and agreed to proceed.   I discussed the assessment and treatment plan with the patient. The patient was provided an opportunity to ask questions and all were answered. The patient agreed with the plan and demonstrated an understanding of the instructions.   The patient was advised to call back or seek an in-person evaluation if the symptoms worsen or if the condition fails to improve as anticipated.  I provided 15 minutes of non-face-to-face time during this encounter.  Janene Harvey. Biagio Borg Avs printed and mailed

## 2021-01-29 NOTE — Telephone Encounter (Signed)
[name], caporaso are scheduled for a virtual visit with your provider today.    Just as we do with appointments in the office, we must obtain your consent to participate.  Your consent will be active for this visit and any virtual visit you may have with one of our providers in the next 365 days.    If you have a MyChart account, I can also send a copy of this consent to you electronically.  All virtual visits are billed to your insurance company just like a traditional visit in the office.  As this is a virtual visit, video technology does not allow for your provider to perform a traditional examination.  This may limit your provider's ability to fully assess your condition.  If your provider identifies any concerns that need to be evaluated in person or the need to arrange testing such as labs, EKG, etc, we will make arrangements to do so.    Although advances in technology are sophisticated, we cannot ensure that it will always work on either your end or our end.  If the connection with a video visit is poor, we may have to switch to a telephone visit.  With either a video or telephone visit, we are not always able to ensure that we have a secure connection.   I need to obtain your verbal consent now.   Are you willing to proceed with your visit today?   Stacey Dalton has provided verbal consent on 01/29/2021 for a virtual visit (video or telephone).   Elveria Royals, [facility] 01/29/2021  3:58 PM

## 2021-02-01 ENCOUNTER — Telehealth: Payer: Self-pay | Admitting: *Deleted

## 2021-02-01 NOTE — Telephone Encounter (Signed)
Received fax from [facility] #[phone] Fax: [phone] for Wheelchair Order.   Placed form in Jessica's folder to review and sign.  For Generic 18x16 Wheelchair for Muscle Weakness

## 2021-02-05 NOTE — Telephone Encounter (Signed)
Paperwork filled out and signed. Faxed back to [facility] Attn: Valma Cava [phone] Fax: [phone]

## 2021-02-12 ENCOUNTER — Ambulatory Visit: Payer: Medicare Other

## 2021-02-19 ENCOUNTER — Encounter: Payer: Self-pay | Admitting: Nurse Practitioner

## 2021-02-19 ENCOUNTER — Other Ambulatory Visit: Payer: Self-pay

## 2021-02-19 ENCOUNTER — Ambulatory Visit (INDEPENDENT_AMBULATORY_CARE_PROVIDER_SITE_OTHER): Payer: Medicare Other | Admitting: Nurse Practitioner

## 2021-02-19 DIAGNOSIS — Z0000 Encounter for general adult medical examination without abnormal findings: Secondary | ICD-10-CM | POA: Diagnosis not present

## 2021-02-19 NOTE — Patient Instructions (Signed)
Stacey Dalton , Thank you for taking time to come for your Medicare Wellness Visit. I appreciate your ongoing commitment to your health goals. Please review the following plan we discussed and let me know if I can assist you in the future.   Screening recommendations/referrals: Colonoscopy aged out Mammogram aged out Bone Density - will need to  Recommended yearly ophthalmology/optometry visit for glaucoma screening and checkup Recommended yearly dental visit for hygiene and checkup  Vaccinations: Influenza vaccine up to date Pneumococcal vaccine up to date Tdap vaccine - up to date Shingles vaccine RECOMMENDED to get at local pharmacy  Advanced directives: recommend to complete living will, to look over MOST and we can complete in office   Conditions/risks identified: advance age, fall risk, progression memory loss  Next appointment: 1 year   Preventive Care 22 Years and Older, Female Preventive care refers to lifestyle choices and visits with your health care provider that can promote health and wellness. What does preventive care include?  A yearly physical exam. This is also called an annual well check.  Dental exams once or twice a year.  Routine eye exams. Ask your health care provider how often you should have your eyes checked.  Personal lifestyle choices, including:  Daily care of your teeth and gums.  Regular physical activity.  Eating a healthy diet.  Avoiding tobacco and drug use.  Limiting alcohol use.  Practicing safe sex.  Taking low-dose aspirin every day.  Taking vitamin and mineral supplements as recommended by your health care provider. What happens during an annual well check? The services and screenings done by your health care provider during your annual well check will depend on your age, overall health, lifestyle risk factors, and family history of disease. Counseling  Your health care provider may ask you questions about your:  Alcohol  use.  Tobacco use.  Drug use.  Emotional well-being.  Home and relationship well-being.  Sexual activity.  Eating habits.  History of falls.  Memory and ability to understand (cognition).  Work and work Astronomer.  Reproductive health. Screening  You may have the following tests or measurements:  Height, weight, and BMI.  Blood pressure.  Lipid and cholesterol levels. These may be checked every 5 years, or more frequently if you are over 54 years old.  Skin check.  Lung cancer screening. You may have this screening every year starting at age 39 if you have a 30-pack-year history of smoking and currently smoke or have quit within the past 15 years.  Fecal occult blood test (FOBT) of the stool. You may have this test every year starting at age 49.  Flexible sigmoidoscopy or colonoscopy. You may have a sigmoidoscopy every 5 years or a colonoscopy every 10 years starting at age 58.  Hepatitis C blood test.  Hepatitis B blood test.  Sexually transmitted disease (STD) testing.  Diabetes screening. This is done by checking your blood sugar (glucose) after you have not eaten for a while (fasting). You may have this done every 1-3 years.  Bone density scan. This is done to screen for osteoporosis. You may have this done starting at age 27.  Mammogram. This may be done every 1-2 years. Talk to your health care provider about how often you should have regular mammograms. Talk with your health care provider about your test results, treatment options, and if necessary, the need for more tests. Vaccines  Your health care provider may recommend certain vaccines, such as:  Influenza vaccine. This is  recommended every year.  Tetanus, diphtheria, and acellular pertussis (Tdap, Td) vaccine. You may need a Td booster every 10 years.  Zoster vaccine. You may need this after age 38.  Pneumococcal 13-valent conjugate (PCV13) vaccine. One dose is recommended after age  62.  Pneumococcal polysaccharide (PPSV23) vaccine. One dose is recommended after age 50. Talk to your health care provider about which screenings and vaccines you need and how often you need them. This information is not intended to replace advice given to you by your health care provider. Make sure you discuss any questions you have with your health care provider. Document Released: 09/28/2015 Document Revised: 05/21/2016 Document Reviewed: 07/03/2015 Elsevier Interactive Patient Education  2017 [place] Prevention in the Home Falls can cause injuries. They can happen to people of all ages. There are many things you can do to make your home safe and to help prevent falls. What can I do on the outside of my home?  Regularly fix the edges of walkways and driveways and fix any cracks.  Remove anything that might make you trip as you walk through a door, such as a raised step or threshold.  Trim any bushes or trees on the path to your home.  Use bright outdoor lighting.  Clear any walking paths of anything that might make someone trip, such as rocks or tools.  Regularly check to see if handrails are loose or broken. Make sure that both sides of any steps have handrails.  Any raised decks and porches should have guardrails on the edges.  Have any leaves, snow, or ice cleared regularly.  Use sand or salt on walking paths during winter.  Clean up any spills in your garage right away. This includes oil or grease spills. What can I do in the bathroom?  Use night lights.  Install grab bars by the toilet and in the tub and shower. Do not use towel bars as grab bars.  Use non-skid mats or decals in the tub or shower.  If you need to sit down in the shower, use a plastic, non-slip stool.  Keep the floor dry. Clean up any water that spills on the floor as soon as it happens.  Remove soap buildup in the tub or shower regularly.  Attach bath mats securely with double-sided  non-slip rug tape.  Do not have throw rugs and other things on the floor that can make you trip. What can I do in the bedroom?  Use night lights.  Make sure that you have a light by your bed that is easy to reach.  Do not use any sheets or blankets that are too big for your bed. They should not hang down onto the floor.  Have a firm chair that has side arms. You can use this for support while you get dressed.  Do not have throw rugs and other things on the floor that can make you trip. What can I do in the kitchen?  Clean up any spills right away.  Avoid walking on wet floors.  Keep items that you use a lot in easy-to-reach places.  If you need to reach something above you, use a strong step stool that has a grab bar.  Keep electrical cords out of the way.  Do not use floor polish or wax that makes floors slippery. If you must use wax, use non-skid floor wax.  Do not have throw rugs and other things on the floor that can make you trip.  What can I do with my stairs?  Do not leave any items on the stairs.  Make sure that there are handrails on both sides of the stairs and use them. Fix handrails that are broken or loose. Make sure that handrails are as long as the stairways.  Check any carpeting to make sure that it is firmly attached to the stairs. Fix any carpet that is loose or worn.  Avoid having throw rugs at the top or bottom of the stairs. If you do have throw rugs, attach them to the floor with carpet tape.  Make sure that you have a light switch at the top of the stairs and the bottom of the stairs. If you do not have them, ask someone to add them for you. What else can I do to help prevent falls?  Wear shoes that:  Do not have high heels.  Have rubber bottoms.  Are comfortable and fit you well.  Are closed at the toe. Do not wear sandals.  If you use a stepladder:  Make sure that it is fully opened. Do not climb a closed stepladder.  Make sure that both  sides of the stepladder are locked into place.  Ask someone to hold it for you, if possible.  Clearly mark and make sure that you can see:  Any grab bars or handrails.  First and last steps.  Where the edge of each step is.  Use tools that help you move around (mobility aids) if they are needed. These include:  Canes.  Walkers.  Scooters.  Crutches.  Turn on the lights when you go into a dark area. Replace any light bulbs as soon as they burn out.  Set up your furniture so you have a clear path. Avoid moving your furniture around.  If any of your floors are uneven, fix them.  If there are any pets around you, be aware of where they are.  Review your medicines with your doctor. Some medicines can make you feel dizzy. This can increase your chance of falling. Ask your doctor what other things that you can do to help prevent falls. This information is not intended to replace advice given to you by your health care provider. Make sure you discuss any questions you have with your health care provider. Document Released: 06/28/2009 Document Revised: 02/07/2016 Document Reviewed: 10/06/2014 Elsevier Interactive Patient Education  2017 [employer].

## 2021-02-19 NOTE — Progress Notes (Signed)
This service is provided via telemedicine  No vital signs collected/recorded due to the encounter was a telemedicine visit.   Location of patient (ex: home, work):  Home  Patient consents to a telephone visit:  Yes, see encounter dated 01/29/2021  Location of the provider (ex: office, home):  [facility]  Name of any referring provider:  N/A  Names of all persons participating in the telemedicine service and their role in the encounter: Abbey Chatters, Nurse Practitioner, Elveria Royals, CMA, patient and patient's daughter [name].  Time spent on call:  14 minutes with medical assistant

## 2021-02-19 NOTE — Progress Notes (Signed)
Subjective:   Stacey Dalton is a 86 y.o. female who presents for Medicare Annual (Subsequent) preventive examination.  Review of Systems     Cardiac Risk Factors include: sedentary lifestyle;hypertension;dyslipidemia;advanced age (>18men, >43 women)     Objective:    There were no vitals filed for this visit. There is no height or weight on file to calculate BMI.  Advanced Directives 02/19/2021 12/03/2020 10/18/2020 06/07/2020 02/06/2020 12/22/2019  Does Patient Have a Medical Advance Directive? Yes No No Yes Yes No  Type of Advance Directive Out of facility DNR (pink MOST or yellow form) - - Midwife;Out of facility DNR (pink MOST or yellow form) Healthcare Power of Attorney -  Does patient want to make changes to medical advance directive? No - Patient declined - - No - Patient declined No - Patient declined No - Patient declined  Copy of Healthcare Power of Attorney in Chart? - - - No - copy requested - -  Would patient like information on creating a medical advance directive? - No - Patient declined - - - Yes (ED - Information included in AVS)  Pre-existing out of facility DNR order (yellow form or pink MOST form) Yellow form placed in chart (order not valid for inpatient use) - - - - -    Current Medications (verified) Outpatient Encounter Medications as of 02/19/2021  Medication Sig  . Acetaminophen (ACETAMINOPHEN EXTRA STRENGTH) 167 MG/5ML LIQD Take 30 mLs (1,002 mg total) by mouth 2 (two) times daily.  Marland Kitchen apixaban (ELIQUIS) 5 MG TABS tablet Take 1 tablet (5 mg total) by mouth 2 (two) times daily.  . cyanocobalamin 1000 MCG tablet Take 1,000 mcg by mouth in the morning.  . donepezil (ARICEPT) 10 MG tablet TAKE 1 TABLET EVERY EVENING  . LORazepam (ATIVAN) 0.5 MG tablet TAKE 1 TABLET DAILY AS NEEDED FOR ANXIETY  . losartan (COZAAR) 50 MG tablet TAKE 1 TABLET DAILY  . memantine (NAMENDA) 5 MG tablet TAKE 1 TABLET TWICE A DAY  . nystatin cream (MYCOSTATIN) Apply 1  application topically 4 (four) times daily as needed (abdominal skin fold rash).  . QUEtiapine (SEROQUEL) 25 MG tablet Take 1 tablet (25 mg total) by mouth at bedtime.  . rosuvastatin (CRESTOR) 20 MG tablet TAKE 1 TABLET DAILY   No facility-administered encounter medications on file as of 02/19/2021.    Allergies (verified) Chamomile flowers [chamomile], Lisinopril, Mixed ragweed, and Other   History: Past Medical History:  Diagnosis Date  . A-fib (HCC)   . Allergic rhinitis 10/21/2010  . Back pain 10/21/2010  . Chronic back pain 10/21/2010  . Colonic polyp 10/21/2018  . Dementia (HCC)   . Diabetes mellitus without complication (HCC)   . Echocardiogram abnormal 11/09/2018  . Epigastric abdominal pain   . History of recent travel   . Hyperlipidemia   . Hypertension   . Incontinence   . Mixed hyperlipidemia   . S/P cholecystectomy 10/21/2010   Past Surgical History:  Procedure Laterality Date  . ABDOMINAL HYSTERECTOMY     partial due to prolapse  . BACK SURGERY  2007  . CATARACT EXTRACTION Left 06/26/2014  . CHOLECYSTECTOMY    . melanoma resection Right 1980   Family History  Problem Relation Age of Onset  . Cancer Mother   . Dementia Father        Natural Death  . Hodgkin's lymphoma Son   . Melanoma Son        Seizures   Social History  Socioeconomic History  . Marital status: Widowed    Spouse name: Not on file  . Number of children: 5  . Years of education: Not on file  . Highest education level: Not on file  Occupational History  . Not on file  Tobacco Use  . Smoking status: Former Games developermoker  . Smokeless tobacco: Never Used  Vaping Use  . Vaping Use: Never used  Substance and Sexual Activity  . Alcohol use: Not Currently  . Drug use: Never  . Sexual activity: Not Currently  Other Topics Concern  . Not on file  Social History Narrative   ** Merged History Encounter **       Diet: Standard -Minimize   Sugar (Carbs)    Do you drink/ eat things with  caffeine?  Yes Caffeinated Hot Tea  Marital status:     Widow                          What year were you married ? 1958  Do you live in a house, apartment,assistred living,    condo, trailer, etc.)?  House  Is it one or more stories? 1 Story [place]  How many persons live in your home ?  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     No                         Type & how often   ADVANCED DIRECTIVES (Please bring copies)  Do you have a living will? No  Do you have a DNR form?   No                    If not, do you want to discuss    one?   Do you have signed POA?HPOA forms?   No              If so, please bring to your appointment  FUNCTIONAL STATUS- To be completed by Spouse / child / Staff  (Child)  Do you have difficulty bathing or dressing yourself ?  Yes  Do you have di   fficulty preparing food or eating ?  Yes  Do you have difficulty managing your mediation ?  Yes  Do you have difficulty managing your finances ?  Yes  Do you have difficulty affording your medication ?  No     Social Determinants of Corporate investment bankerHealth   Financial Resource Strain: Not on file  Food Insecurity: Not on file  Transportation Needs: Not on file  Physical Activity: Not on file  Stress: Not on file  Social Connections: Not on file    Tobacco Counseling Counseling given: Not Answered   Clinical Intake:     Pain : No/denies pain     BMI - recorded: 33 Nutritional Status: BMI > 30  Obese Nutritional Risks: None Diabetes: No  How often do you need to have someone help you when you read instructions, pamphlets, or other written materials from your doctor or pharmacy?: 5 - Always  Diabetic?no         Activities of Daily Living In your present state of health, do you have any difficulty performing the following activities: 02/19/2021  Hearing?  N  Difficulty concentrating  or making decisions? Y  Walking or climbing stairs? Y  Dressing or bathing? Y  Doing errands, shopping? Y  Preparing Food and eating ? Y  Comment food has to be prepared  Using the Toilet? Y  Comment needs assistance with clothes  In the past six months, have you accidently leaked urine? Y  Do you have problems with loss of bowel control? Y  Managing your Medications? Y  Managing your Finances? Y  Comment daughter Chief Executive Officer or managing your Housekeeping? Y  Some recent data might be hidden    Patient Care Team: Sharon Seller, NP as PCP - General (Geriatric Medicine) Sharon Seller, NP (Geriatric Medicine)  Indicate any recent Medical Services you may have received from other than [facility] providers in the past year (date may be approximate).     Assessment:   This is a routine wellness examination for [place].  Hearing/Vision screen  Hearing Screening   125Hz  250Hz  500Hz  1000Hz  2000Hz  3000Hz  4000Hz  6000Hz  8000Hz   Right ear:           Left ear:           Comments: Patient has no hearing problems  Vision Screening Comments: Patient has no vision problems. Patient has not had recent eye exam.  Dietary issues and exercise activities discussed: Current Exercise Habits: The patient does not participate in regular exercise at present  Goals Addressed   None    Depression Screen PHQ 2/9 Scores 02/19/2021 10/18/2020 02/06/2020 12/22/2019  PHQ - 2 Score 0 0 0 0    Fall Risk Fall Risk  02/19/2021 12/10/2020 10/18/2020 02/06/2020 01/23/2020  Falls in the past year? 1 1 1 1 1   Comment - - - - December 09, 2019, missed step on curb.  Number falls in past yr: 1 1 0 0 0  Injury with Fall? 1 0 1 0 (No Data)  Comment - - - - Facial bruising.  Risk for fall due to : - History of fall(s);Impaired balance/gait - - -    FALL RISK PREVENTION PERTAINING TO THE HOME:  Any stairs in or around the home? No  If so, are there any without handrails? No  Home  free of loose throw rugs in walkways, pet beds, electrical cords, etc? Yes  Adequate lighting in your home to reduce risk of falls? Yes   ASSISTIVE DEVICES UTILIZED TO PREVENT FALLS:  Life alert? No  Use of a cane, walker or w/c? Yes  Grab bars in the bathroom? Yes  Shower chair or bench in shower? Yes  Elevated toilet seat or a handicapped toilet? No   TIMED UP AND GO:  Was the test performed? No .   Cognitive Function:        Immunizations Immunization History  Administered Date(s) Administered  . Influenza, High Dose Seasonal PF 06/28/2012, 08/01/2013, 06/12/2014, 07/23/2015, 07/08/2016, 06/15/2017, 07/12/2019  . Influenza,inj,Quad PF,6+ Mos 06/01/2018  . Influenza-Unspecified 07/04/2009, 06/26/2011, 06/11/2020  . Moderna Sars-Covid-2 Vaccination 10/04/2019, 11/03/2019, 07/13/2020  . PPD Test 03/02/2020  . Pneumococcal Conjugate-13 06/12/2014  . Pneumococcal Polysaccharide-23 08/15/2000, 09/15/2008  . Td 09/15/2004  . Tdap 12/09/2019  . Zoster, Live 06/16/2011, 07/31/2011    TDAP status: Up to date  Flu Vaccine status: Up to date  Pneumococcal vaccine status: Up to date  Covid-19 vaccine status: Completed vaccines  Qualifies for Shingles Vaccine? Yes   Zostavax completed Yes   Shingrix Completed?: No.    Education has been provided regarding the importance of this  vaccine. Patient has been advised to call insurance company to determine out of pocket expense if they have not yet received this vaccine. Advised may also receive vaccine at local pharmacy or Health Dept. Verbalized acceptance and understanding.  Screening Tests Health Maintenance  Topic Date Due  . Zoster Vaccines- Shingrix (1 of 2) Never done  . DEXA SCAN  Never done  . INFLUENZA VACCINE  04/15/2021  . TETANUS/TDAP  12/08/2029  . COVID-19 Vaccine  Completed  . PNA vac Low Risk Adult  Completed  . Pneumococcal Vaccine 16-80 Years old  Aged Out  . HPV VACCINES  Aged Out    Health  Maintenance  Health Maintenance Due  Topic Date Due  . Zoster Vaccines- Shingrix (1 of 2) Never done  . DEXA SCAN  Never done    Colorectal cancer screening: No longer required.   Mammogram status: No longer required due to age.  Bone Density status: Completed  . Results reflect: Bone density results: OSTEOPOROSIS. Repeat every 2 years.  Lung Cancer Screening: (Low Dose CT Chest recommended if Age 75-80 years, 30 pack-year currently smoking OR have quit w/in 15years.) does not qualify.   Additional Screening:  Hepatitis C Screening: does not qualify;   Vision Screening: Recommended annual ophthalmology exams for early detection of glaucoma and other disorders of the eye. Is the patient up to date with their annual eye exam?  No  Who is the provider or what is the name of the office in which the patient attends annual eye exams?  If pt is not established with a provider, would they like to be referred to a provider to establish care? No .   Dental Screening: Recommended annual dental exams for proper oral hygiene  Community Resource Referral / Chronic Care Management: CRR required this visit?  No   CCM required this visit?  No      Plan:     I have personally reviewed and noted the following in the patient's chart:   . Medical and social history . Use of alcohol, tobacco or illicit drugs  . Current medications and supplements including opioid prescriptions.  . Functional ability and status . Nutritional status . Physical activity . Advanced directives . List of other physicians . Hospitalizations, surgeries, and ER visits in previous 12 months . Vitals . Screenings to include cognitive, depression, and falls . Referrals and appointments  In addition, I have reviewed and discussed with patient certain preventive protocols, quality metrics, and best practice recommendations. A written personalized care plan for preventive services as well as general preventive health  recommendations were provided to patient.     Sharon Seller, NP   02/19/2021    Virtual Visit via Telephone Note  I connected with@ on 02/19/21 at  3:45 PM EDT by telephone and verified that I am speaking with the correct person using two identifiers.  Location: Patient: home Provider: [facility]   I discussed the limitations, risks, security and privacy concerns of performing an evaluation and management service by telephone and the availability of in person appointments. I also discussed with the patient that there may be a patient responsible charge related to this service. The patient expressed understanding and agreed to proceed.   I discussed the assessment and treatment plan with the patient. The patient was provided an opportunity to ask questions and all were answered. The patient agreed with the plan and demonstrated an understanding of the instructions.   The patient was advised to call  back or seek an in-person evaluation if the symptoms worsen or if the condition fails to improve as anticipated.  I provided 15 minutes of non-face-to-face time during this encounter.  Janene Harvey. Biag[employer] printed and mailed

## 2021-02-25 ENCOUNTER — Other Ambulatory Visit: Payer: Self-pay

## 2021-02-25 ENCOUNTER — Ambulatory Visit (INDEPENDENT_AMBULATORY_CARE_PROVIDER_SITE_OTHER): Payer: Medicare Other | Admitting: *Deleted

## 2021-02-25 DIAGNOSIS — M810 Age-related osteoporosis without current pathological fracture: Secondary | ICD-10-CM | POA: Diagnosis not present

## 2021-02-25 MED ORDER — DENOSUMAB 60 MG/ML ~~LOC~~ SOSY
60.0000 mg | PREFILLED_SYRINGE | Freq: Once | SUBCUTANEOUS | Status: AC
Start: 2021-02-25 — End: 2021-02-25
  Administered 2021-02-25: 60 mg via SUBCUTANEOUS

## 2021-03-07 ENCOUNTER — Ambulatory Visit: Payer: Medicare Other | Admitting: Internal Medicine

## 2021-03-13 ENCOUNTER — Ambulatory Visit: Payer: Medicare Other | Admitting: Nurse Practitioner

## 2021-03-19 ENCOUNTER — Telehealth: Payer: Self-pay

## 2021-03-19 NOTE — Telephone Encounter (Signed)
Order will need to be faxed to [phone] if [name] decides to have patient take once daily as listed on our medication list as the [facility] at the facility still have BID listed   Patient is out of medication due to taking once daily

## 2021-03-19 NOTE — Telephone Encounter (Signed)
Message left on clinical intake voicemail:  Patients daughter is concerned about patients losartan  Medication list states once daily but the facility is administering 50 mg twice daily.  I called [facility] and spoke with [facility], patient receiving losartan 50 mg twice daily since Jan 5,2022   I reviewed our records and I do see where patient was receiving losartan 50 mg twice daily and on 01/01/2021 a refill was done for once daily with no documentation as to why.    I asked Madison for some recent b/p readings, patients b/p is checked once monthly, 7/1 (108/72), 6/1 (110/62), and 5/1 (122/81).  Please advise if patient should be taking once or twice daily

## 2021-03-20 ENCOUNTER — Encounter: Payer: Self-pay | Admitting: Nurse Practitioner

## 2021-03-20 ENCOUNTER — Other Ambulatory Visit: Payer: Self-pay

## 2021-03-20 ENCOUNTER — Ambulatory Visit (INDEPENDENT_AMBULATORY_CARE_PROVIDER_SITE_OTHER): Payer: Medicare Other | Admitting: Nurse Practitioner

## 2021-03-20 VITALS — BP 110/80 | HR 86 | Temp 96.9°F | Ht 63.0 in | Wt 187.8 lb

## 2021-03-20 DIAGNOSIS — G301 Alzheimer's disease with late onset: Secondary | ICD-10-CM

## 2021-03-20 DIAGNOSIS — I10 Essential (primary) hypertension: Secondary | ICD-10-CM

## 2021-03-20 DIAGNOSIS — M810 Age-related osteoporosis without current pathological fracture: Secondary | ICD-10-CM | POA: Diagnosis not present

## 2021-03-20 DIAGNOSIS — F02818 Dementia in other diseases classified elsewhere, unspecified severity, with other behavioral disturbance: Secondary | ICD-10-CM

## 2021-03-20 DIAGNOSIS — F419 Anxiety disorder, unspecified: Secondary | ICD-10-CM

## 2021-03-20 DIAGNOSIS — E2839 Other primary ovarian failure: Secondary | ICD-10-CM

## 2021-03-20 DIAGNOSIS — I4821 Permanent atrial fibrillation: Secondary | ICD-10-CM

## 2021-03-20 DIAGNOSIS — E782 Mixed hyperlipidemia: Secondary | ICD-10-CM

## 2021-03-20 DIAGNOSIS — F0281 Dementia in other diseases classified elsewhere with behavioral disturbance: Secondary | ICD-10-CM

## 2021-03-20 LAB — CBC WITH DIFFERENTIAL/PLATELET
Absolute Monocytes: 544 cells/uL (ref 200–950)
Basophils Absolute: 24 cells/uL (ref 0–200)
Basophils Relative: 0.3 %
Eosinophils Absolute: 72 cells/uL (ref 15–500)
Eosinophils Relative: 0.9 %
HCT: 39.9 % (ref 35.0–45.0)
Hemoglobin: 13 g/dL (ref 11.7–15.5)
Lymphs Abs: 1808 cells/uL (ref 850–3900)
MCH: 30 pg (ref 27.0–33.0)
MCHC: 32.6 g/dL (ref 32.0–36.0)
MCV: 92.1 fL (ref 80.0–100.0)
MPV: 10.3 fL (ref 7.5–12.5)
Monocytes Relative: 6.8 %
Neutro Abs: 5552 cells/uL (ref 1500–7800)
Neutrophils Relative %: 69.4 %
Platelets: 226 10*3/uL (ref 140–400)
RBC: 4.33 10*6/uL (ref 3.80–5.10)
RDW: 12.6 % (ref 11.0–15.0)
Total Lymphocyte: 22.6 %
WBC: 8 10*3/uL (ref 3.8–10.8)

## 2021-03-20 LAB — COMPLETE METABOLIC PANEL WITH GFR
AG Ratio: 2 (calc) (ref 1.0–2.5)
ALT: 12 U/L (ref 6–29)
AST: 13 U/L (ref 10–35)
Albumin: 4.4 g/dL (ref 3.6–5.1)
Alkaline phosphatase (APISO): 56 U/L (ref 37–153)
BUN: 17 mg/dL (ref 7–25)
CO2: 26 mmol/L (ref 20–32)
Calcium: 9.1 mg/dL (ref 8.6–10.4)
Chloride: 106 mmol/L (ref 98–110)
Creat: 0.66 mg/dL (ref 0.60–0.88)
GFR, Est African American: 95 mL/min/{1.73_m2} (ref >=60)
GFR, Est Non African American: 82 mL/min/{1.73_m2} (ref >=60)
Globulin: 2.2 g/dL (calc) (ref 1.9–3.7)
Glucose, Bld: 109 mg/dL — ABNORMAL HIGH (ref 65–99)
Potassium: 4.4 mmol/L (ref 3.5–5.3)
Sodium: 141 mmol/L (ref 135–146)
Total Bilirubin: 0.7 mg/dL (ref 0.2–1.2)
Total Protein: 6.6 g/dL (ref 6.1–8.1)

## 2021-03-20 LAB — LIPID PANEL
Cholesterol: 125 mg/dL (ref <200)
HDL: 40 mg/dL — ABNORMAL LOW (ref >=50)
LDL Cholesterol (Calc): 65 mg/dL (calc)
Non-HDL Cholesterol (Calc): 85 mg/dL (calc) (ref <130)
Total CHOL/HDL Ratio: 3.1 (calc) (ref <5.0)
Triglycerides: 121 mg/dL (ref <150)

## 2021-03-20 NOTE — Telephone Encounter (Signed)
She should be taking once daily, continue to monitor BP at facility.

## 2021-03-20 NOTE — Telephone Encounter (Signed)
Order faxed to facility

## 2021-03-20 NOTE — Progress Notes (Signed)
Careteam: Patient Care Team: Lauree Chandler, NP as PCP - General (Geriatric Medicine) Lauree Chandler, NP (Geriatric Medicine)  PLACE OF SERVICE:  [place] Directive information Type of Advance Directive: Out of facility DNR (pink MOST or yellow form)  Allergies  Allergen Reactions   Chamomile Flowers [Chamomile]    Lisinopril    Mixed Ragweed    Other     Seasonal allergies, cats    Chief Complaint  Patient presents with   Medical Management of Chronic Issues    4 month follow up. Concerns about Losartan. Patient has been given losartan twice a day at facility instead of once daily.Daughter has concerns about other medications. [employer] FMLA paperwork filled out   Health Maintenance    Zoster vaccine,Dexa scan 2nd COVID booster     HPI: Patient is a 86 y.o. female for routine follow up. She has been getting losartan 50 mg by mouth twice daily blood pressure. Blood pressure has been well controlled at facility.   Pain controlled on tylenol twice daily  No recent falls. Has been going through PT which has helped and built up her strength.    Dementia with behaviors- refuses to take aricept last night, appers she generally takes. No increase in behaviors overall or confusion   Review of Systems:  Review of Systems  Unable to perform ROS: Dementia   Past Medical History:  Diagnosis Date   A-fib ([place])    Allergic rhinitis 10/21/2010   Back pain 10/21/2010   Chronic back pain 10/21/2010   Colonic polyp 10/21/2018   Dementia ([place])    Diabetes mellitus without complication ([place])    Echocardiogram abnormal 11/09/2018   Epigastric abdominal pain    History of recent travel    Hyperlipidemia    Hypertension    Incontinence    Mixed hyperlipidemia    S/P cholecystectomy 10/21/2010   Past Surgical History:  Procedure Laterality Date   ABDOMINAL HYSTERECTOMY     partial due to prolapse   BACK SURGERY  2007   CATARACT EXTRACTION Left 06/26/2014    CHOLECYSTECTOMY     melanoma resection Right 1980   Social History:   reports that she has quit smoking. She has never used smokeless tobacco. She reports previous alcohol use. She reports that she does not use drugs.  Family History  Problem Relation Age of Onset   Cancer Mother    Dementia Father        Natural Death   Hodgkin's lymphoma Son    Melanoma Son        Seizures    Medications: Patient's Medications  New Prescriptions   No medications on file  Previous Medications   ACETAMINOPHEN (ACETAMINOPHEN EXTRA STRENGTH) 167 MG/5ML LIQD    Take 30 mLs (1,002 mg total) by mouth 2 (two) times daily.   APIXABAN (ELIQUIS) 5 MG TABS TABLET    Take 1 tablet (5 mg total) by mouth 2 (two) times daily.   CYANOCOBALAMIN 1000 MCG TABLET    Take 1,000 mcg by mouth in the morning.   DONEPEZIL (ARICEPT) 10 MG TABLET    TAKE 1 TABLET EVERY EVENING   LORAZEPAM (ATIVAN) 0.5 MG TABLET    TAKE 1 TABLET DAILY AS NEEDED FOR ANXIETY   LOSARTAN (COZAAR) 50 MG TABLET    TAKE 1 TABLET DAILY   MEMANTINE (NAMENDA) 5 MG TABLET    TAKE 1 TABLET TWICE A DAY   NYSTATIN CREAM (MYCOSTATIN)    Apply  1 application topically 4 (four) times daily as needed (abdominal skin fold rash).   QUETIAPINE (SEROQUEL) 25 MG TABLET    Take 1 tablet (25 mg total) by mouth at bedtime.   ROSUVASTATIN (CRESTOR) 20 MG TABLET    TAKE 1 TABLET DAILY  Modified Medications   No medications on file  Discontinued Medications   No medications on file    Physical Exam:  Vitals:   03/20/21 0922  BP: 110/80  Pulse: 86  Temp: (!) 96.9 F (36.1 C)  TempSrc: Temporal  SpO2: 97%  Weight: 187 lb 12.8 oz (85.2 kg)  Height: _0  (1.6 m)   Body mass index is 33.27 kg/m. Wt Readings from Last 3 Encounters:  03/20/21 187 lb 12.8 oz (85.2 kg)  12/15/20 187 lb 6.3 oz (85 kg)  12/10/20 188 lb (85.3 kg)    Physical Exam Constitutional:      General: She is not in acute distress.    Appearance: She is well-developed. She is not  diaphoretic.  HENT:     Head: Normocephalic and atraumatic.     Mouth/Throat:     Pharynx: No oropharyngeal exudate.  Eyes:     Conjunctiva/sclera: Conjunctivae normal.     Pupils: Pupils are equal, round, and reactive to light.  Cardiovascular:     Rate and Rhythm: Normal rate and regular rhythm.     Heart sounds: Normal heart sounds.  Pulmonary:     Effort: Pulmonary effort is normal.     Breath sounds: Normal breath sounds.  Abdominal:     General: Bowel sounds are normal.     Palpations: Abdomen is soft.  Musculoskeletal:     Cervical back: Normal range of motion and neck supple.     Right lower leg: No edema.     Left lower leg: No edema.  Skin:    General: Skin is warm and dry.  Neurological:     Mental Status: She is alert. Mental status is at baseline.     Gait: Gait abnormal (slow).  Psychiatric:        Mood and Affect: Mood normal.    Labs reviewed: Basic Metabolic Panel: Recent Labs    10/18/20 1621 12/10/20 1607 12/13/20 1829 12/13/20 1845  NA 142 141  --  138  K 4.5 4.3  --  3.9  CL 106 106  --  107  CO2 25 25  --  25  GLUCOSE 94 108  --  103*  BUN 20 17  --  17  CREATININE 0.77 0.72  --  0.74  CALCIUM 9.3 9.1  --  9.0  TSH 2.65  --  2.173  --    Liver Function Tests: Recent Labs    10/18/20 1621 12/10/20 1607 12/13/20 1845  AST _1 ALT _2 ALKPHOS  --   --  45  BILITOT 0.5 0.7 0.7  PROT 6.3 6.5 6.2*  ALBUMIN  --   --  3.7   No results for input(s): LIPASE, AMYLASE in the last 8760 hours. No results for input(s): AMMONIA in the last 8760 hours. CBC: Recent Labs    10/18/20 1621 12/10/20 1607 12/13/20 1845  WBC 8.0 8.4 8.3  NEUTROABS 5,224 5,233 5.0  HGB 13.8 14.4 14.7  HCT 41.1 42.6 45.3  MCV 91.5 90.8 94.0  PLT 222 260 246   Lipid Panel: Recent Labs    06/07/20 1604  CHOL 124  HDL 38*  LDLCALC 62  TRIG  163*  CHOLHDL 3.3   TSH: Recent Labs    10/18/20 1621 12/13/20 1829  TSH 2.65 2.173   A1C: Lab  Results  Component Value Date   HGBA1C 5.9 10/04/2018     Assessment/Plan 1. Age-related osteoporosis without current pathological fracture -continues on prolia, no recent bone density.  - DG Bone Density; Future  2. Late onset Alzheimer's disease with behavioral disturbance (HCC) Stable, continues on aricept and namenda. Living at [facility] and they help with ADls and medication.   3. Anxiety -stable at this time. Has ativan PRN, has not needed recently.   4. Essential hypertension -controlled at this time, she is taking losartan 50 mg twice daily vs once daily. Will have facility decrease to once daily and continue to monitor BP. Continue low sodium deit.  - CMP with eGFR([facility]) - CBC with Differential/Platelet  5. Mixed hyperlipidemia -stable on crestor 20 mg daily with dietary modifications. - Lipid panel - CMP with eGFR([facility])  6. Permanent atrial fibrillation (HCC) -stable continues on eliquis for anticoagulation. No signs of bruising or bleeding - CBC with Differential/Platelet  7. Estrogen deficiency - DG Bone Density; Future   Next appt: 4 months. Sooner if needed  [employer]. [place], [facility] [phone]

## 2021-03-22 DIAGNOSIS — Z029 Encounter for administrative examinations, unspecified: Secondary | ICD-10-CM

## 2021-05-09 ENCOUNTER — Telehealth: Payer: Self-pay | Admitting: *Deleted

## 2021-05-09 NOTE — Telephone Encounter (Signed)
Received fax from [name] requesting Annual FL2 form to be signed by [name] for their records.  Placed FL2 Form in Stacey Dalton's folder to review and sign. To be faxed back to [phone] once completed.

## 2021-05-22 NOTE — Telephone Encounter (Signed)
FL2 Form signed and faxed back to Facility.

## 2021-06-18 ENCOUNTER — Other Ambulatory Visit: Payer: Self-pay | Admitting: Nurse Practitioner

## 2021-06-18 DIAGNOSIS — F02818 Dementia in other diseases classified elsewhere, unspecified severity, with other behavioral disturbance: Secondary | ICD-10-CM

## 2021-07-05 ENCOUNTER — Telehealth: Payer: Self-pay | Admitting: *Deleted

## 2021-07-05 NOTE — Telephone Encounter (Signed)
Received fax from [facility] stating that the Memantine 5mg  are unavailable. Brand Namenda 5mg  is not an option. Memantine 10mg  is available but not a scored tablet. Please indicate alternative of your choice and fill out form for new medication.   Per Jessica--call patient's daughter and see if they want to Stop medication or find a different pharmacy. Cannot 1/2 half the medication and cannot increase it due to kidneys.    Called daughter, and she stated that she will call around for a different pharmacy that Stacey Dalton have the medication and will accept patient's insurance and will let know.   Awaiting call back.

## 2021-07-08 ENCOUNTER — Other Ambulatory Visit: Payer: Self-pay | Admitting: *Deleted

## 2021-07-08 DIAGNOSIS — G301 Alzheimer's disease with late onset: Secondary | ICD-10-CM

## 2021-07-08 DIAGNOSIS — I4821 Permanent atrial fibrillation: Secondary | ICD-10-CM

## 2021-07-08 DIAGNOSIS — F02818 Dementia in other diseases classified elsewhere, unspecified severity, with other behavioral disturbance: Secondary | ICD-10-CM

## 2021-07-08 MED ORDER — APIXABAN 5 MG PO TABS: 5.0000 mg | ORAL_TABLET | Freq: Two times a day (BID) | ORAL | 3 refills | Status: DC

## 2021-07-08 MED ORDER — QUETIAPINE FUMARATE 25 MG PO TABS: 25.0000 mg | ORAL_TABLET | Freq: Every day | ORAL | 1 refills | Status: DC

## 2021-07-08 NOTE — Telephone Encounter (Signed)
[facility] requested refill.  

## 2021-07-08 NOTE — Addendum Note (Signed)
Addended by: Nelda Severe A on: 07/08/2021 10:51 AM   Modules accepted: Orders

## 2021-07-08 NOTE — Telephone Encounter (Signed)
[name], daughter called and stated that [facility] received the medication and has mailed out the Rx to her. Stated that she doesn't need a new pharmacy the mediation is on the way.

## 2021-07-16 ENCOUNTER — Encounter: Payer: Self-pay | Admitting: Nurse Practitioner

## 2021-07-22 ENCOUNTER — Emergency Department (HOSPITAL_COMMUNITY): Payer: Medicare Other

## 2021-07-22 ENCOUNTER — Emergency Department (HOSPITAL_COMMUNITY)
Admission: EM | Admit: 2021-07-22 | Discharge: 2021-07-22 | Disposition: A | Discharge status: Home / Self Care | Payer: Medicare Other | Attending: Emergency Medicine | Admitting: Emergency Medicine

## 2021-07-22 ENCOUNTER — Other Ambulatory Visit: Payer: Self-pay

## 2021-07-22 ENCOUNTER — Encounter (HOSPITAL_COMMUNITY): Payer: Self-pay | Admitting: Emergency Medicine

## 2021-07-22 DIAGNOSIS — F0390 Unspecified dementia without behavioral disturbance: Secondary | ICD-10-CM | POA: Insufficient documentation

## 2021-07-22 DIAGNOSIS — Z79899 Other long term (current) drug therapy: Secondary | ICD-10-CM | POA: Insufficient documentation

## 2021-07-22 DIAGNOSIS — W01198A Fall on same level from slipping, tripping and stumbling with subsequent striking against other object, initial encounter: Secondary | ICD-10-CM | POA: Insufficient documentation

## 2021-07-22 DIAGNOSIS — Y9301 Activity, walking, marching and hiking: Secondary | ICD-10-CM | POA: Diagnosis not present

## 2021-07-22 DIAGNOSIS — Z87891 Personal history of nicotine dependence: Secondary | ICD-10-CM | POA: Insufficient documentation

## 2021-07-22 DIAGNOSIS — Z7901 Long term (current) use of anticoagulants: Secondary | ICD-10-CM | POA: Insufficient documentation

## 2021-07-22 DIAGNOSIS — E119 Type 2 diabetes mellitus without complications: Secondary | ICD-10-CM | POA: Diagnosis not present

## 2021-07-22 DIAGNOSIS — S0003XA Contusion of scalp, initial encounter: Secondary | ICD-10-CM | POA: Diagnosis not present

## 2021-07-22 DIAGNOSIS — I10 Essential (primary) hypertension: Secondary | ICD-10-CM | POA: Insufficient documentation

## 2021-07-22 DIAGNOSIS — Y92129 Unspecified place in nursing home as the place of occurrence of the external cause: Secondary | ICD-10-CM | POA: Insufficient documentation

## 2021-07-22 DIAGNOSIS — I4891 Unspecified atrial fibrillation: Secondary | ICD-10-CM | POA: Diagnosis not present

## 2021-07-22 DIAGNOSIS — S0990XA Unspecified injury of head, initial encounter: Secondary | ICD-10-CM | POA: Diagnosis present

## 2021-07-22 LAB — CBG MONITORING, ED: Glucose-Capillary: 110 mg/dL — ABNORMAL HIGH (ref 70–99)

## 2021-07-22 NOTE — ED Notes (Signed)
Patient transported to CT 2

## 2021-07-22 NOTE — ED Triage Notes (Signed)
Pt bib EMS from [facility] facility. Was standing with tech when pt fell and hit her head on the wall and then a chair. Unknown LOC status. Dementia and confused per baseline.   EMS vitals stable

## 2021-07-22 NOTE — ED Provider Notes (Signed)
[facility] EMERGENCY DEPARTMENT Provider Note   CSN: [id number] Arrival date & time: 07/22/21  [id number]     History No chief complaint on file.   Stacey Dalton is a 86 y.o. female.  Patient presents to the emergency department for evaluation after a fall.  Patient does have a history of dementia.  She presents from skilled nursing facility.  Patient was reportedly walking with assistance when she lost her balance and fell into a wall.  She hit her head on the wall and then fell to the ground.  She reportedly hit her head on a chair as she fell as well.  Patient cannot provide any further information because of her baseline dementia.  Level 5 caveat.      Past Medical History:  Diagnosis Date   A-fib ([place])    Allergic rhinitis 10/21/2010   Back pain 10/21/2010   Chronic back pain 10/21/2010   Colonic polyp 10/21/2018   Dementia ([place])    Diabetes mellitus without complication ([place])    Echocardiogram abnormal 11/09/2018   Epigastric abdominal pain    History of recent travel    Hyperlipidemia    Hypertension    Incontinence    Mixed hyperlipidemia    S/P cholecystectomy 10/21/2010    Patient Active Problem List   Diagnosis Date Noted   Overactive bladder 02/06/2020   Fall 12/22/2019   SAH (subarachnoid hemorrhage) ([place]) 12/09/2019   Leg cramps 04/11/2019   Urinary incontinence 04/11/2019   Atrial fibrillation ([place]) 10/12/2018   Right-sided low back pain without sciatica 10/11/2018   Dementia ([place]) 06/01/2018   Senile osteoporosis 11/20/2017   Healthcare maintenance 10/07/2017   Prediabetes 04/06/2017   Chest pain 11/11/2015   Palpitations 11/11/2015   Primary osteoarthritis of right hand 12/22/2014   Bilateral cataracts 06/12/2014   Heme positive stool 12/30/2013   Heart block, first degree 05/20/2013   Allergic rhinitis 10/21/2010   Colonic polyp 10/21/2010   Hypertension 10/21/2010   LVH (left ventricular hypertrophy) due to hypertensive  disease 10/21/2010   Mixed hyperlipidemia 10/21/2010    Past Surgical History:  Procedure Laterality Date   ABDOMINAL HYSTERECTOMY     partial due to prolapse   BACK SURGERY  2007   CATARACT EXTRACTION Left 06/26/2014   CHOLECYSTECTOMY     melanoma resection Right 1980     OB History   No obstetric history on file.     Family History  Problem Relation Age of Onset   Cancer Mother    Dementia Father        Natural Death   Hodgkin's lymphoma Son    Melanoma Son        Seizures    Social History   Tobacco Use   Smoking status: Former   Smokeless tobacco: Never  Scientific laboratory technician Use: Never used  Substance Use Topics   Alcohol use: Not Currently   Drug use: Never    Home Medications Prior to Admission medications   Medication Sig Start Date End Date Taking? Authorizing Provider  Acetaminophen (ACETAMINOPHEN EXTRA STRENGTH) 167 MG/5ML LIQD Take 30 mLs (1,002 mg total) by mouth 2 (two) times daily. 12/10/20   Lauree Chandler, NP  apixaban (ELIQUIS) 5 MG TABS tablet Take 1 tablet (5 mg total) by mouth 2 (two) times daily. 07/08/21   Lauree Chandler, NP  cyanocobalamin 1000 MCG tablet Take 1,000 mcg by mouth in the morning.    [provider]  donepezil (ARICEPT) 10  MG tablet TAKE 1 TABLET EVERY EVENING 06/19/21   Lauree Chandler, NP  LORazepam (ATIVAN) 0.5 MG tablet TAKE 1 TABLET DAILY AS NEEDED FOR ANXIETY 09/20/20   Reed, Tiffany L, DO  losartan (COZAAR) 50 MG tablet TAKE 1 TABLET DAILY 01/01/21   Lauree Chandler, NP  memantine (NAMENDA) 5 MG tablet TAKE 1 TABLET TWICE A DAY 01/01/21   Lauree Chandler, NP  nystatin cream (MYCOSTATIN) Apply 1 application topically 4 (four) times daily as needed (abdominal skin fold rash).    [provider]  QUEtiapine (SEROQUEL) 25 MG tablet Take 1 tablet (25 mg total) by mouth at bedtime. 07/08/21   Lauree Chandler, NP  rosuvastatin (CRESTOR) 20 MG tablet TAKE 1 TABLET DAILY 01/01/21   Lauree Chandler, NP    Allergies    Chamomile flowers [chamomile], Lisinopril, Mixed ragweed, and Other  Review of Systems   Review of Systems  Unable to perform ROS: Dementia   Physical Exam Updated Vital Signs BP 132/70   Pulse 76   Temp 98.6 F (37 C) (Oral)   Resp 12   Ht 5\' 4"  (1.626 m)   Wt 77.1 kg   SpO2 98%   BMI 29.18 kg/m   Physical Exam Vitals and nursing note reviewed.  Constitutional:      General: She is not in acute distress.    Appearance: Normal appearance. She is well-developed.  HENT:     Head: Normocephalic and atraumatic.     Right Ear: Hearing normal.     Left Ear: Hearing normal.     Nose: Nose normal.  Eyes:     Conjunctiva/sclera: Conjunctivae normal.     Pupils: Pupils are equal, round, and reactive to light.  Cardiovascular:     Rate and Rhythm: Regular rhythm.     Heart sounds: S1 normal and S2 normal. No murmur heard.   No friction rub. No gallop.  Pulmonary:     Effort: Pulmonary effort is normal. No respiratory distress.     Breath sounds: Normal breath sounds.  Chest:     Chest wall: No tenderness.  Abdominal:     General: Bowel sounds are normal.     Palpations: Abdomen is soft.     Tenderness: There is no abdominal tenderness. There is no guarding or rebound. Negative signs include Murphy's sign and McBurney's sign.     Hernia: No hernia is present.  Musculoskeletal:        General: Normal range of motion.     Cervical back: Normal range of motion and neck supple.  Skin:    General: Skin is warm and dry.     Findings: No rash.  Neurological:     Mental Status: She is alert. Mental status is at baseline. She is confused.     GCS: GCS eye subscore is 4. GCS verbal subscore is 4. GCS motor subscore is 6.     Cranial Nerves: No cranial nerve deficit.     Sensory: No sensory deficit.     Coordination: Coordination normal.  Psychiatric:        Speech: Speech normal.        Behavior: Behavior normal.        Thought Content: Thought content  normal.    ED Results / Procedures / Treatments   Labs (all labs ordered are listed, but only abnormal results are displayed) Labs Reviewed  CBG MONITORING, ED - Abnormal; Notable for the following components:  Result Value   Glucose-Capillary 110 (*)    All other components within normal limits    EKG None  Radiology CT HEAD WO CONTRAST ( )  Result Date: 07/22/2021 CLINICAL DATA:  86 year old female with dementia status post fall and hitting her head on the wall and then the chair. EXAM: CT HEAD WITHOUT CONTRAST CT CERVICAL SPINE WITHOUT CONTRAST TECHNIQUE: Multidetector CT imaging of the head and cervical spine was performed following the standard protocol without intravenous contrast. Multiplanar CT image reconstructions of the cervical spine were also generated. COMPARISON:  Prior CT scan of the head 12/15/2020 FINDINGS: CT HEAD FINDINGS Brain: No evidence of acute infarction, hemorrhage, hydrocephalus, extra-axial collection or mass lesion/mass effect. Cerebral and cerebellar cortical atrophy. Mild central atrophy resulting in ex vacuo dilatation of the lateral ventricles. Periventricular white matter hypoattenuation most consistent with chronic microvascular ischemia. Vascular: No hyperdense vessel or unexpected calcification. Skull: Normal. Negative for fracture or focal lesion. Hyperostosis frontalis interna. Sinuses/Orbits: No acute finding. Other: None. CT CERVICAL SPINE FINDINGS Alignment: Normal for age. Probable minimal (2 mm) degenerative anterolisthesis of C3 on C4 and C4 on C5. Skull base and vertebrae: No acute fracture. No primary bone lesion or focal pathologic process. Soft tissues and spinal canal: No prevertebral fluid or swelling. No visible canal hematoma. Disc levels:  No significant level specific disease. Upper chest: Negative. Other: None. IMPRESSION: CT HEAD 1. No acute intracranial abnormality. 2. Stable appearance of cortical and central atrophy, ex vacuo  ventriculomegaly and chronic microvascular ischemic white matter disease. CT CSPINE 1. No acute fracture or malalignment. Electronically Signed   By: Malachy Moan M.D.   On: 07/22/2021 07:09   CT CERVICAL SPINE WO CONTRAST  Result Date: 07/22/2021 CLINICAL DATA:  86 year old female with dementia status post fall and hitting her head on the wall and then the chair. EXAM: CT HEAD WITHOUT CONTRAST CT CERVICAL SPINE WITHOUT CONTRAST TECHNIQUE: Multidetector CT imaging of the head and cervical spine was performed following the standard protocol without intravenous contrast. Multiplanar CT image reconstructions of the cervical spine were also generated. COMPARISON:  Prior CT scan of the head 12/15/2020 FINDINGS: CT HEAD FINDINGS Brain: No evidence of acute infarction, hemorrhage, hydrocephalus, extra-axial collection or mass lesion/mass effect. Cerebral and cerebellar cortical atrophy. Mild central atrophy resulting in ex vacuo dilatation of the lateral ventricles. Periventricular white matter hypoattenuation most consistent with chronic microvascular ischemia. Vascular: No hyperdense vessel or unexpected calcification. Skull: Normal. Negative for fracture or focal lesion. Hyperostosis frontalis interna. Sinuses/Orbits: No acute finding. Other: None. CT CERVICAL SPINE FINDINGS Alignment: Normal for age. Probable minimal (2 mm) degenerative anterolisthesis of C3 on C4 and C4 on C5. Skull base and vertebrae: No acute fracture. No primary bone lesion or focal pathologic process. Soft tissues and spinal canal: No prevertebral fluid or swelling. No visible canal hematoma. Disc levels:  No significant level specific disease. Upper chest: Negative. Other: None. IMPRESSION: CT HEAD 1. No acute intracranial abnormality. 2. Stable appearance of cortical and central atrophy, ex vacuo ventriculomegaly and chronic microvascular ischemic white matter disease. CT CSPINE 1. No acute fracture or malalignment. Electronically  Signed   By: Malachy Moan M.D.   On: 07/22/2021 07:09   DG Pelvis Portable  Result Date: 07/22/2021 CLINICAL DATA:  86 year old female status post fall. On blood thinners. EXAM: PORTABLE PELVIS 1-2 VIEWS COMPARISON:  CT hip 12/03/2020. FINDINGS: Portable AP supine view at 0638 hours. Chronic lower lumbar posterior spinal fusion hardware. Femoral heads normally located. Grossly intact proximal femurs.  Pelvis appears intact. Stable pubic symphysis and SI joints. Chronic dystrophic calcification lateral to the right femoral shaft, probably from remote trauma. Negative lower abdominal and pelvic visceral contours. IMPRESSION: No acute fracture or dislocation identified about the pelvis. If there is lateralizing hip pain, recommend dedicated hip series. Electronically Signed   By: Genevie Ann M.D.   On: 07/22/2021 06:53   DG Chest Port 1 View  Result Date: 07/22/2021 CLINICAL DATA:  86 year old female status post fall. On blood thinners. EXAM: PORTABLE CHEST 1 VIEW COMPARISON:  Portable chest 01/06/2021 and earlier. FINDINGS: Portable AP semi upright view at 0636 hours. Mildly lower lung volumes. Stable cardiac size and mediastinal contours. Visualized tracheal air column is within normal limits. Skin fold artifact in the left upper lung. Allowing for portable technique the lungs are clear. No pneumothorax or pleural effusion identified. No acute osseous abnormality identified. Paucity of bowel gas in the upper abdomen. IMPRESSION: Lower lung volumes. No acute cardiopulmonary abnormality or acute traumatic injury identified. Electronically Signed   By: Genevie Ann M.D.   On: 07/22/2021 06:51    Procedures Procedures   Medications Ordered in ED Medications - No data to display  ED Course  I have reviewed the triage vital signs and the nursing notes.  Pertinent labs & imaging results that were available during my care of the patient were reviewed by me and considered in my medical decision making (see chart  for details).    MDM Rules/Calculators/A&P                           Patient brought to the emergency department after a ground-level fall.  Patient reportedly lost her balance while walking and hit her head on the wall and then a chair.  She has dementia, cannot provide any information.  Examination does not reveal any significant external signs of injury.  No significant cephalhematomas, bruising, abrasions or laceration.  Based on stated history, CT head and cervical spine were performed and are negative.  Final Clinical Impression(s) / ED Diagnoses Final diagnoses:  Contusion of scalp, initial encounter    Rx / DC Orders ED Discharge Orders     None        Zebadiah Willert, Gwenyth Allegra, MD 07/22/21 [phone]

## 2021-07-22 NOTE — ED Notes (Signed)
[facility] contacted, transportation set up, [facility] called and this RN gave report to Hollice Espy., med tech.

## 2021-07-24 ENCOUNTER — Other Ambulatory Visit: Payer: Self-pay | Admitting: *Deleted

## 2021-07-24 DIAGNOSIS — F419 Anxiety disorder, unspecified: Secondary | ICD-10-CM

## 2021-07-24 MED ORDER — LORAZEPAM 0.5 MG PO TABS: ORAL_TABLET | ORAL | 3 refills | Status: DC

## 2021-07-24 NOTE — Telephone Encounter (Signed)
Received fax from facility stating that patient needs refill.  Epic LR: 09/20/2020 Contract on File dated: 06/07/2020 Note added to upcoming appointment to update.   Pended Rx and sent to [facility] for approval.

## 2021-08-29 ENCOUNTER — Ambulatory Visit: Payer: Medicare Other

## 2021-08-30 ENCOUNTER — Ambulatory Visit: Payer: Medicare Other | Admitting: Nurse Practitioner

## 2021-09-05 ENCOUNTER — Telehealth: Payer: Self-pay | Admitting: *Deleted

## 2021-09-05 NOTE — Telephone Encounter (Signed)
[name], daughter called and stated that [facility] sent a letter stating that the Crestor was a pilot program under the [employer] and it cost 0. But now the program has ended it will start costing $12 for 90 day supply.   Daughter was wondering if there was anything cheaper for Cholesterol.   I instructed her to call the [employer] and see if they covered anything else that was comparable to Crestor that would be cheaper and to let us know then we could send message to provider and see if it would benefit the patient.   She agreed.

## 2021-09-11 ENCOUNTER — Other Ambulatory Visit: Payer: Self-pay

## 2021-09-11 ENCOUNTER — Ambulatory Visit (INDEPENDENT_AMBULATORY_CARE_PROVIDER_SITE_OTHER): Payer: Medicare Other | Admitting: *Deleted

## 2021-09-11 DIAGNOSIS — M810 Age-related osteoporosis without current pathological fracture: Secondary | ICD-10-CM

## 2021-09-11 MED ORDER — DENOSUMAB 60 MG/ML ~~LOC~~ SOSY
60.0000 mg | PREFILLED_SYRINGE | Freq: Once | SUBCUTANEOUS | Status: AC
  Administered 2021-09-11: 16:00:00 60 mg via SUBCUTANEOUS

## 2021-09-23 ENCOUNTER — Other Ambulatory Visit: Payer: TRICARE For Life (TFL)

## 2021-10-28 ENCOUNTER — Other Ambulatory Visit: Payer: Self-pay

## 2021-10-28 ENCOUNTER — Encounter: Payer: Self-pay | Admitting: Nurse Practitioner

## 2021-10-28 ENCOUNTER — Ambulatory Visit (INDEPENDENT_AMBULATORY_CARE_PROVIDER_SITE_OTHER): Payer: Medicare Other | Admitting: Nurse Practitioner

## 2021-10-28 VITALS — BP 110/78 | HR 81 | Temp 96.9°F | Ht 64.0 in | Wt 201.4 lb

## 2021-10-28 DIAGNOSIS — M810 Age-related osteoporosis without current pathological fracture: Secondary | ICD-10-CM | POA: Diagnosis not present

## 2021-10-28 DIAGNOSIS — G301 Alzheimer's disease with late onset: Secondary | ICD-10-CM | POA: Diagnosis not present

## 2021-10-28 DIAGNOSIS — I10 Essential (primary) hypertension: Secondary | ICD-10-CM

## 2021-10-28 DIAGNOSIS — F02818 Dementia in other diseases classified elsewhere, unspecified severity, with other behavioral disturbance: Secondary | ICD-10-CM | POA: Diagnosis not present

## 2021-10-28 DIAGNOSIS — I4821 Permanent atrial fibrillation: Secondary | ICD-10-CM

## 2021-10-28 DIAGNOSIS — R600 Localized edema: Secondary | ICD-10-CM

## 2021-10-28 DIAGNOSIS — E782 Mixed hyperlipidemia: Secondary | ICD-10-CM | POA: Diagnosis not present

## 2021-10-28 DIAGNOSIS — D6869 Other thrombophilia: Secondary | ICD-10-CM

## 2021-10-28 NOTE — Progress Notes (Signed)
Careteam: Patient Care Team: Lauree Chandler, NP as PCP - General (Geriatric Medicine) Lauree Chandler, NP (Geriatric Medicine)  PLACE OF SERVICE:  [place] Directive information Does Patient Have a Medical Advance Directive?: Yes, Type of Advance Directive: Out of facility DNR (pink MOST or yellow form)  Allergies  Allergen Reactions   Chamomile Flowers [Chamomile]    Lisinopril    Mixed Ragweed    Other     Seasonal allergies, cats    Chief Complaint  Patient presents with   Medical Management of Chronic Issues    4 month follow up. Sign medication contract.   Health Maintenance    Discuss the need for shnigrix vaccine, dexa scan     HPI: Patient is a 87 y.o. female for routine follow up.   She has gained 30 lbs since November, daughter reports clothes are tighter.  No significant swelling. She is on her feet walking around. Does not sit down . No shortness of breath.   Last fall was in November, she has been good since then, no additional fall.  She hit her head and went to the ED for evaluation due to being on eliquis.   Anxiety- does not need lorazepam frequently. Last dose was Feb2nd.   Dementia- continues to stay at [facility] memory care. On aricept and namenda.  Continues on seroquel 25 mg at bedtime   Hyperlipidemia- continues on crestor.   Hypertension- continues on losartan   Review of Systems:  Review of Systems  Constitutional:  Negative for chills, fever and weight loss.  HENT:  Negative for tinnitus.   Respiratory:  Negative for cough, sputum production and shortness of breath.   Cardiovascular:  Positive for leg swelling. Negative for chest pain and palpitations.  Gastrointestinal:  Negative for abdominal pain, constipation, diarrhea and heartburn.  Genitourinary:  Negative for dysuria, frequency and urgency.  Musculoskeletal:  Negative for back pain, falls, joint pain and myalgias.  Skin: Negative.   Neurological:   Negative for dizziness and headaches.  Psychiatric/Behavioral:  Positive for memory loss. Negative for depression. The patient does not have insomnia.    Past Medical History:  Diagnosis Date   A-fib ([place])    Allergic rhinitis 10/21/2010   Back pain 10/21/2010   Chronic back pain 10/21/2010   Colonic polyp 10/21/2018   Dementia ([place])    Diabetes mellitus without complication ([place])    Echocardiogram abnormal 11/09/2018   Epigastric abdominal pain    History of recent travel    Hyperlipidemia    Hypertension    Incontinence    Mixed hyperlipidemia    S/P cholecystectomy 10/21/2010   Past Surgical History:  Procedure Laterality Date   ABDOMINAL HYSTERECTOMY     partial due to prolapse   BACK SURGERY  2007   CATARACT EXTRACTION Left 06/26/2014   CHOLECYSTECTOMY     melanoma resection Right 1980   Social History:   reports that she has quit smoking. She has never used smokeless tobacco. She reports that she does not currently use alcohol. She reports that she does not use drugs.  Family History  Problem Relation Age of Onset   Cancer Mother    Dementia Father        Natural Death   Hodgkin's lymphoma Son    Melanoma Son        Seizures    Medications: Patient's Medications  New Prescriptions   No medications on file  Previous Medications   ACETAMINOPHEN (  ACETAMINOPHEN EXTRA STRENGTH) 167 MG/5ML LIQD    Take 30 mLs (1,002 mg total) by mouth 2 (two) times daily.   APIXABAN (ELIQUIS) 5 MG TABS TABLET    Take 1 tablet (5 mg total) by mouth 2 (two) times daily.   CYANOCOBALAMIN 1000 MCG TABLET    Take 1,000 mcg by mouth in the morning.   DONEPEZIL (ARICEPT) 10 MG TABLET    TAKE 1 TABLET EVERY EVENING   LORAZEPAM (ATIVAN) 0.5 MG TABLET    Take one tablet by mouth once daily as needed for anxiety.   LOSARTAN (COZAAR) 50 MG TABLET    TAKE 1 TABLET DAILY   MEMANTINE (NAMENDA) 5 MG TABLET    TAKE 1 TABLET TWICE A DAY   NYSTATIN CREAM (MYCOSTATIN)    Apply 1 application  topically 4 (four) times daily as needed (abdominal skin fold rash).   QUETIAPINE (SEROQUEL) 25 MG TABLET    Take 1 tablet (25 mg total) by mouth at bedtime.   ROSUVASTATIN (CRESTOR) 20 MG TABLET    TAKE 1 TABLET DAILY  Modified Medications   No medications on file  Discontinued Medications   No medications on file    Physical Exam:  Vitals:   10/28/21 1600  BP: 110/78  Pulse: 81  Temp: (!) 96.9 F (36.1 C)  TempSrc: Temporal  SpO2: 95%  Weight: 201 lb 6.4 oz (91.4 kg)  Height: 5' 4" (1.626 m)   Body mass index is 34.57 kg/m. Wt Readings from Last 3 Encounters:  10/28/21 201 lb 6.4 oz (91.4 kg)  07/22/21 170 lb (77.1 kg)  03/20/21 187 lb 12.8 oz (85.2 kg)    Physical Exam Constitutional:      General: She is not in acute distress.    Appearance: She is well-developed. She is not diaphoretic.  HENT:     Head: Normocephalic and atraumatic.     Mouth/Throat:     Pharynx: No oropharyngeal exudate.  Eyes:     Conjunctiva/sclera: Conjunctivae normal.     Pupils: Pupils are equal, round, and reactive to light.  Cardiovascular:     Rate and Rhythm: Normal rate. Rhythm irregular.     Heart sounds: Normal heart sounds.  Pulmonary:     Effort: Pulmonary effort is normal.     Breath sounds: Normal breath sounds.  Abdominal:     General: Bowel sounds are normal.     Palpations: Abdomen is soft.  Musculoskeletal:     Cervical back: Normal range of motion and neck supple.     Right lower leg: Edema (2+) present.     Left lower leg: Edema (2+) present.  Skin:    General: Skin is warm and dry.  Neurological:     Mental Status: She is alert.  Psychiatric:        Mood and Affect: Mood normal.    Labs reviewed: Basic Metabolic Panel: Recent Labs    12/10/20 1607 12/13/20 1829 12/13/20 1845 03/20/21 0953  NA 141  --  138 141  K 4.3  --  3.9 4.4  CL 106  --  107 106  CO2 25  --  25 26  GLUCOSE 108  --  103* 109*  BUN 17  --  17 17  CREATININE 0.72  --  0.74 0.66   CALCIUM 9.1  --  9.0 9.1  TSH  --  2.173  --   --    Liver Function Tests: Recent Labs    12/10/20 1607 12/13/20 1845  03/20/21 0953  AST _0 ALT _1 ALKPHOS  --  45  --   BILITOT 0.7 0.7 0.7  PROT 6.5 6.2* 6.6  ALBUMIN  --  3.7  --    No results for input(s): LIPASE, AMYLASE in the last 8760 hours. No results for input(s): AMMONIA in the last 8760 hours. CBC: Recent Labs    12/10/20 1607 12/13/20 1845 03/20/21 0953  WBC 8.4 8.3 8.0  NEUTROABS 5,233 5.0 5,552  HGB 14.4 14.7 13.0  HCT 42.6 45.3 39.9  MCV 90.8 94.0 92.1  PLT 260 246 226   Lipid Panel: Recent Labs    03/20/21 0953  CHOL 125  HDL 40*  LDLCALC 65  TRIG 121  CHOLHDL 3.1   TSH: Recent Labs    12/13/20 1829  TSH 2.173   A1C: Lab Results  Component Value Date   HGBA1C 5.9 10/04/2018     Assessment/Plan 1. Late onset Alzheimer's disease with behavioral disturbance ([place]) -stable, continues with supportive care.  -continues on aricept and namenda -no changes in behaviors.   2. Age-related osteoporosis without current pathological fracture -Recommended to take calcium 600 mg twice daily with Vitamin D 2000 units daily and weight bearing activity 30 mins/5 days a week  3. Essential hypertension -Blood pressure well controlled Continue current medications Recheck metabolic panel - CBC with Differential/Platelet - CMP with eGFR([facility])  4. Mixed hyperlipidemia -cholesterol at goal. Continue current regimen. - CMP with eGFR([facility]) - Lipid panel  5. Permanent atrial fibrillation (HCC) -rate controlled - CBC with Differential/Platelet - Brain Natriuretic Peptide  6. Bilateral lower extremity edema -due to significant weight gain with LE edema will follow up BNP.  --encouraged to elevate legs above level of heart as tolerates, low sodium diet, compression hose as tolerates (on in am, off in pm) - Brain Natriuretic Peptide  7. Acquired thrombophilia ([place]) -stable on eliquis   -monitor CBC  4 month  Teddie Curd K. [place], [facility] [phone]

## 2021-10-29 LAB — COMPLETE METABOLIC PANEL WITH GFR
AG Ratio: 2 (calc) (ref 1.0–2.5)
ALT: 15 U/L (ref 6–29)
AST: 16 U/L (ref 10–35)
Albumin: 4.5 g/dL (ref 3.6–5.1)
Alkaline phosphatase (APISO): 55 U/L (ref 37–153)
BUN: 21 mg/dL (ref 7–25)
CO2: 24 mmol/L (ref 20–32)
Calcium: 9.5 mg/dL (ref 8.6–10.4)
Chloride: 107 mmol/L (ref 98–110)
Creat: 0.6 mg/dL (ref 0.60–0.95)
Globulin: 2.3 g/dL (calc) (ref 1.9–3.7)
Glucose, Bld: 91 mg/dL (ref 65–139)
Potassium: 4.2 mmol/L (ref 3.5–5.3)
Sodium: 142 mmol/L (ref 135–146)
Total Bilirubin: 0.5 mg/dL (ref 0.2–1.2)
Total Protein: 6.8 g/dL (ref 6.1–8.1)
eGFR: 88 mL/min/{1.73_m2} (ref >=60)

## 2021-10-29 LAB — CBC WITH DIFFERENTIAL/PLATELET
Absolute Monocytes: 510 cells/uL (ref 200–950)
Basophils Absolute: 30 cells/uL (ref 0–200)
Basophils Relative: 0.4 %
Eosinophils Absolute: 60 cells/uL (ref 15–500)
Eosinophils Relative: 0.8 %
HCT: 40.5 % (ref 35.0–45.0)
Hemoglobin: 13.5 g/dL (ref 11.7–15.5)
Lymphs Abs: 1830 cells/uL (ref 850–3900)
MCH: 30.5 pg (ref 27.0–33.0)
MCHC: 33.3 g/dL (ref 32.0–36.0)
MCV: 91.4 fL (ref 80.0–100.0)
MPV: 10.4 fL (ref 7.5–12.5)
Monocytes Relative: 6.8 %
Neutro Abs: 5070 cells/uL (ref 1500–7800)
Neutrophils Relative %: 67.6 %
Platelets: 225 10*3/uL (ref 140–400)
RBC: 4.43 10*6/uL (ref 3.80–5.10)
RDW: 12 % (ref 11.0–15.0)
Total Lymphocyte: 24.4 %
WBC: 7.5 10*3/uL (ref 3.8–10.8)

## 2021-10-29 LAB — LIPID PANEL
Cholesterol: 133 mg/dL (ref <200)
HDL: 40 mg/dL — ABNORMAL LOW (ref >=50)
LDL Cholesterol (Calc): 73 mg/dL (calc)
Non-HDL Cholesterol (Calc): 93 mg/dL (calc) (ref <130)
Total CHOL/HDL Ratio: 3.3 (calc) (ref <5.0)
Triglycerides: 115 mg/dL (ref <150)

## 2021-10-29 LAB — BRAIN NATRIURETIC PEPTIDE: Brain Natriuretic Peptide: 75 pg/mL (ref <100)

## 2021-12-09 ENCOUNTER — Telehealth: Payer: Self-pay | Admitting: *Deleted

## 2021-12-09 NOTE — Telephone Encounter (Signed)
Received fax from [employer] #[phone] requesting [name] to sign an updated MAR for patient.  ? ?Placed in Jessica's folder to review and sign.  ?To be faxed back to [facility] at [facility] [phone] ?

## 2021-12-27 ENCOUNTER — Other Ambulatory Visit: Payer: Self-pay | Admitting: Nurse Practitioner

## 2022-01-06 ENCOUNTER — Other Ambulatory Visit: Payer: Self-pay | Admitting: Nurse Practitioner

## 2022-01-06 DIAGNOSIS — G301 Alzheimer's disease with late onset: Secondary | ICD-10-CM

## 2022-01-28 ENCOUNTER — Ambulatory Visit
Admission: RE | Admit: 2022-01-28 | Discharge: 2022-01-28 | Disposition: A | Discharge status: Home / Self Care | Payer: Medicare Other | Source: Ambulatory Visit | Attending: Nurse Practitioner | Admitting: Nurse Practitioner

## 2022-01-28 DIAGNOSIS — E2839 Other primary ovarian failure: Secondary | ICD-10-CM

## 2022-01-28 DIAGNOSIS — M810 Age-related osteoporosis without current pathological fracture: Secondary | ICD-10-CM

## 2022-01-29 ENCOUNTER — Encounter: Payer: Self-pay | Admitting: Nurse Practitioner

## 2022-01-29 NOTE — Progress Notes (Signed)
This encounter was created in error - please disregard.

## 2022-01-29 NOTE — Telephone Encounter (Signed)
Please follow up with this in regards to the phone msg. If she finds a gummy or supplement that is chewable I can write for that. Otherwise we can just have her modify her diet as tolerated  ?

## 2022-01-30 NOTE — Telephone Encounter (Signed)
Vitafusion gummy vitamins Calcium & vit D 500 mg tablet Take one by mouth twice daily.

## 2022-01-31 NOTE — Telephone Encounter (Signed)
Calcium + vit D gummies [phone] mg -units chew one by mouth twice daily for osteoporosis and weight bearing walking exercises 5 days per week with physical Therapy orders to be faxed to [facility].

## 2022-02-14 ENCOUNTER — Telehealth: Payer: Self-pay

## 2022-02-14 NOTE — Telephone Encounter (Signed)
Order has been faxed twice to number [phone], if daughter wants to come by and pick up order Rodena Piety has a copy of the order.

## 2022-02-14 NOTE — Telephone Encounter (Signed)
Stacey Dalton with [facility] called for 1934/11/03 Stacey Dalton, stating she spoke with the patient's daughter who says calcium with D order was to be sent over for gummies but she haven't received anything [phone](fax)- cell [phone] -pharmacy fax [phone]

## 2022-02-18 NOTE — Telephone Encounter (Signed)
Noted  

## 2022-02-19 ENCOUNTER — Ambulatory Visit (INDEPENDENT_AMBULATORY_CARE_PROVIDER_SITE_OTHER): Payer: Medicare Other | Admitting: Family

## 2022-02-19 ENCOUNTER — Encounter: Payer: Self-pay | Admitting: Family

## 2022-02-19 VITALS — BP 120/80 | HR 83 | Temp 97.7°F | Resp 18 | Ht 64.0 in | Wt 200.6 lb

## 2022-02-19 DIAGNOSIS — G301 Alzheimer's disease with late onset: Secondary | ICD-10-CM | POA: Diagnosis not present

## 2022-02-19 DIAGNOSIS — I4821 Permanent atrial fibrillation: Secondary | ICD-10-CM | POA: Diagnosis not present

## 2022-02-19 DIAGNOSIS — E782 Mixed hyperlipidemia: Secondary | ICD-10-CM | POA: Diagnosis not present

## 2022-02-19 DIAGNOSIS — I10 Essential (primary) hypertension: Secondary | ICD-10-CM

## 2022-02-19 DIAGNOSIS — R350 Frequency of micturition: Secondary | ICD-10-CM | POA: Diagnosis not present

## 2022-02-19 DIAGNOSIS — R410 Disorientation, unspecified: Secondary | ICD-10-CM

## 2022-02-19 DIAGNOSIS — F02818 Dementia in other diseases classified elsewhere, unspecified severity, with other behavioral disturbance: Secondary | ICD-10-CM

## 2022-02-19 DIAGNOSIS — M810 Age-related osteoporosis without current pathological fracture: Secondary | ICD-10-CM

## 2022-02-19 DIAGNOSIS — B3789 Other sites of candidiasis: Secondary | ICD-10-CM

## 2022-02-19 LAB — POCT URINALYSIS DIPSTICK
Bilirubin, UA: NEGATIVE
Glucose, UA: NEGATIVE
Ketones, UA: POSITIVE
Nitrite, UA: POSITIVE
Protein, UA: NEGATIVE
Spec Grav, UA: >=1.03 — AB (ref 1.010–1.025)
Urobilinogen, UA: NEGATIVE E.U./dL — AB
pH, UA: 5 (ref 5.0–8.0)

## 2022-02-19 MED ORDER — CALCIUM/VITAMIN D3 GUMMIES 200-5 MG-MCG PO CHEW: 200.0000 mg | CHEWABLE_TABLET | Freq: Two times a day (BID) | ORAL | 1 refills | Status: AC

## 2022-02-19 MED ORDER — NYSTATIN 100000 UNIT/GM EX POWD: 1.0000 "application " | Freq: Three times a day (TID) | CUTANEOUS | 0 refills | Status: DC

## 2022-02-19 NOTE — Progress Notes (Signed)
Provider: Sybil Shrader Dalton   Stacey Chandler, Stacey Dalton  Patient Care Team: Stacey Chandler, Stacey Dalton as PCP - General (Geriatric Medicine) Stacey Chandler, Stacey Dalton (Geriatric Medicine)  Extended Emergency Contact Information Primary Emergency Contact: Genevie Cheshire Mobile Phone: [phone] Relation: Daughter Secondary Emergency Contact: Michael,Tom Mobile Phone: [phone] Relation: Son  Code Status:  DNR Goals of care: Advanced Directive information    02/19/2022    3:52 PM  Advanced Directives  Does Patient Have a Medical Advance Directive? No  Would patient like information on creating a medical advance directive? No - Patient declined     Chief Complaint  Patient presents with   Medical Management of Chronic Issues    4 month follow up.   Concern     Possible UTI/Wants nystatin powder for under right breast.    HPI:  Pt is a 87 y.o. female seen today for 4 months follow up for medical management of chronic diseases. Has a medical history of Hypertension,Afib,Hyperlipidemia,Type 2 DM,Dementia among others.  Possible UTI - staff at the [facility] facility reported urine has been cloudy   Redness under the breast    Has had one pound weight gain since last visit which is beneficial for patient.   Past Medical History:  Diagnosis Date   A-fib ([place])    Allergic rhinitis 10/21/2010   Back pain 10/21/2010   Chronic back pain 10/21/2010   Colonic polyp 10/21/2018   Dementia ([place])    Diabetes mellitus without complication ([place])    Echocardiogram abnormal 11/09/2018   Epigastric abdominal pain    History of recent travel    Hyperlipidemia    Hypertension    Incontinence    Mixed hyperlipidemia    S/P cholecystectomy 10/21/2010   Past Surgical History:  Procedure Laterality Date   ABDOMINAL HYSTERECTOMY     partial due to prolapse   BACK SURGERY  2007   CATARACT EXTRACTION Left 06/26/2014   CHOLECYSTECTOMY     melanoma resection Right 1980     Allergies  Allergen Reactions   Chamomile Flowers [Chamomile]    Lisinopril    Mixed Ragweed    Other     Seasonal allergies, cats    Allergies as of 02/19/2022       Reactions   Chamomile Flowers [chamomile]    Lisinopril    Mixed Ragweed    Other    Seasonal allergies, cats        Medication List        Accurate as of February 19, 2022  3:53 PM. If you have any questions, ask your nurse or doctor.          Acetaminophen Extra Strength 167 MG/5ML Liqd Generic drug: Acetaminophen Take 30 mLs (1,002 mg total) by mouth 2 (two) times daily.   apixaban 5 MG Tabs tablet Commonly known as: Eliquis Take 1 tablet (5 mg total) by mouth 2 (two) times daily.   CALCIUM/VITAMIN D3 GUMMIES PO Chew one by mouth twice daily.   cyanocobalamin 1000 MCG tablet Take 1,000 mcg by mouth in the morning.   donepezil 10 MG tablet Commonly known as: ARICEPT TAKE 1 TABLET EVERY EVENING   LORazepam 0.5 MG tablet Commonly known as: ATIVAN Take one tablet by mouth once daily as needed for anxiety.   losartan 50 MG tablet Commonly known as: COZAAR TAKE 1 TABLET DAILY   memantine 5 MG tablet Commonly known as: NAMENDA TAKE 1 TABLET TWICE A DAY   nystatin cream Commonly  known as: MYCOSTATIN Apply 1 application topically 4 (four) times daily as needed (abdominal skin fold rash).   QUEtiapine 25 MG tablet Commonly known as: SEROQUEL TAKE 1 TABLET AT BEDTIME   rosuvastatin 20 MG tablet Commonly known as: CRESTOR TAKE 1 TABLET DAILY        Review of Systems  Unable to perform ROS: Dementia (HPI information provided by patient's daughter)  Constitutional:  Negative for appetite change, chills, fatigue, fever and unexpected weight change.  HENT:  Negative for congestion, dental problem, ear discharge, ear pain, facial swelling, hearing loss, nosebleeds, postnasal drip, rhinorrhea, sinus pressure, sinus pain, sneezing, sore throat, tinnitus and trouble swallowing.   Eyes:   Negative for pain, discharge, redness, itching and visual disturbance.  Respiratory:  Negative for cough, chest tightness, shortness of breath and wheezing.   Cardiovascular:  Negative for chest pain, palpitations and leg swelling.  Gastrointestinal:  Negative for abdominal distention, abdominal pain, blood in stool, constipation, diarrhea, nausea and vomiting.  Endocrine: Negative for cold intolerance, heat intolerance, polydipsia, polyphagia and polyuria.  Genitourinary:  Positive for frequency. Negative for difficulty urinating, dysuria, flank pain and urgency.       Cloudy urine reported by staff   Musculoskeletal:  Positive for gait problem. Negative for arthralgias, back pain, joint swelling, myalgias, neck pain and neck stiffness.  Skin:  Negative for color change, pallor, rash and wound.  Neurological:  Negative for dizziness, syncope, speech difficulty, weakness, light-headedness, numbness and headaches.  Hematological:  Does not bruise/bleed easily.  Psychiatric/Behavioral:  Negative for agitation, behavioral problems, confusion, hallucinations and sleep disturbance. The patient is not nervous/anxious.        Memory loss    Immunization History  Administered Date(s) Administered   Influenza, High Dose Seasonal PF 06/28/2012, 08/01/2013, 06/12/2014, 07/23/2015, 07/08/2016, 06/15/2017, 07/12/2019, 06/10/2021   Influenza,inj,Quad PF,6+ Mos 06/01/2018   Influenza-Unspecified 07/04/2009, 06/26/2011, 06/11/2020   Moderna Sars-Covid-2 Vaccination 10/04/2019, 10/27/2019, 07/13/2020   PPD Test 03/02/2020   Pfizer Covid-19 Vaccine Bivalent Booster 63yrs & up 05/30/2021   Pneumococcal Conjugate-13 06/12/2014   Pneumococcal Polysaccharide-23 08/15/2000, 09/15/2008   Td 09/15/2004   Tdap 12/09/2019   Zoster Recombinat (Shingrix) 06/16/2011, 07/31/2011   Pertinent  Health Maintenance Due  Topic Date Due   INFLUENZA VACCINE  04/15/2022   DEXA SCAN  Completed      01/06/2021    5:23 AM  02/19/2021    3:55 PM 07/22/2021    7:25 AM 10/28/2021    3:57 PM 02/19/2022    3:52 PM  Fall Risk  Falls in the past year?  1  1 0  Was there an injury with Fall?  1  1 0  Fall Risk Category Calculator  3  3 0  Fall Risk Category  High  High Low  Patient Fall Risk Level High fall risk High fall risk High fall risk High fall risk Low fall risk  Patient at Risk for Falls Due to    History of fall(s) No Fall Risks  Fall risk Follow up    Falls evaluation completed Falls evaluation completed   Functional Status Survey:    Vitals:   02/19/22 1545  BP: 120/80  Pulse: 83  Resp: 18  Temp: 97.7 F (36.5 C)  SpO2: 93%  Weight: 200 lb 9.6 oz (91 kg)  Height: 5\' 4"  (1.626 m)   Body mass index is 34.43 kg/m. Physical Exam Vitals reviewed.  Constitutional:      General: She is not in acute distress.  Appearance: Normal appearance. She is normal weight. She is not ill-appearing or diaphoretic.  HENT:     Head: Normocephalic.     Right Ear: Tympanic membrane, ear canal and external ear normal. There is no impacted cerumen.     Left Ear: Tympanic membrane, ear canal and external ear normal. There is no impacted cerumen.     Nose: Nose normal. No congestion or rhinorrhea.     Mouth/Throat:     Mouth: Mucous membranes are moist.     Pharynx: Oropharynx is clear. No oropharyngeal exudate or posterior oropharyngeal erythema.  Eyes:     General: No scleral icterus.       Right eye: No discharge.        Left eye: No discharge.     Extraocular Movements: Extraocular movements intact.     Conjunctiva/sclera: Conjunctivae normal.     Pupils: Pupils are equal, round, and reactive to light.  Neck:     Vascular: No carotid bruit.  Cardiovascular:     Rate and Rhythm: Normal rate. Rhythm irregular.     Pulses: Normal pulses.     Heart sounds: Normal heart sounds. No murmur heard.    No friction rub. No gallop.  Pulmonary:     Effort: Pulmonary effort is normal. No respiratory distress.      Breath sounds: Normal breath sounds. No wheezing, rhonchi or rales.  Chest:     Chest wall: No tenderness.  Abdominal:     General: Bowel sounds are normal. There is no distension.     Palpations: Abdomen is soft. There is no mass.     Tenderness: There is no abdominal tenderness. There is no right CVA tenderness, left CVA tenderness, guarding or rebound.  Musculoskeletal:        General: No swelling or tenderness. Normal range of motion.     Cervical back: Normal range of motion. No rigidity or tenderness.     Right lower leg: No edema.     Left lower leg: No edema.  Lymphadenopathy:     Cervical: No cervical adenopathy.  Skin:    General: Skin is warm and dry.     Coloration: Skin is not pale.     Findings: Erythema present. No bruising, lesion or rash.     Comments: Right breast and abdominal skin fold beefy erythema.   Neurological:     Mental Status: She is alert and oriented to person, place, and time.     Cranial Nerves: No cranial nerve deficit.     Sensory: No sensory deficit.     Motor: No weakness.     Coordination: Coordination normal.     Gait: Gait normal.  Psychiatric:        Mood and Affect: Mood normal.        Speech: Speech normal.        Behavior: Behavior normal.        Thought Content: Thought content normal.        Cognition and Memory: Memory is impaired.     Labs reviewed: Recent Labs    03/20/21 0953 10/28/21 1635  NA 141 142  K 4.4 4.2  CL 106 107  CO2 26 24  GLUCOSE 109* 91  BUN 17 21  CREATININE 0.66 0.60  CALCIUM 9.1 9.5   Recent Labs    03/20/21 0953 10/28/21 1635  AST 13 16  ALT 12 15  BILITOT 0.7 0.5  PROT 6.6 6.8   Recent Labs  03/20/21 0953 10/28/21 1635  WBC 8.0 7.5  NEUTROABS 5,552 5,070  HGB 13.0 13.5  HCT 39.9 40.5  MCV 92.1 91.4  PLT 226 225   Lab Results  Component Value Date   TSH 2.173 12/13/2020   Lab Results  Component Value Date   HGBA1C 5.9 10/04/2018   Lab Results  Component Value Date    CHOL 133 10/28/2021   HDL 40 (L) 10/28/2021   LDLCALC 73 10/28/2021   TRIG 115 10/28/2021   CHOLHDL 3.3 10/28/2021    Significant Diagnostic Results in last 30 days:  DG Bone Density  Result Date: 01/28/2022 EXAM: DUAL X-RAY ABSORPTIOMETRY (DXA) FOR BONE MINERAL DENSITY IMPRESSION: Referring Physician:  Lauree Dalton Your patient completed a bone mineral density test using GE Lunar iDXA system (analysis version: 16). Technologist: Willard PATIENT: Name: Stacey Dalton, Stacey Dalton Patient ID: [id number] Birth Date: [birth year] Height: 61.5 in. Sex: Female Measured: 01/28/2022 Weight: 199.0 lbs. Indications: Advanced Age, Aricept, Caucasian, Dementia (290.0), Diabetic non insulin, Eliquis, Estrogen Deficient, Namenda, Postmenopausal, Seroquel Fractures: Treatments: ASSESSMENT: The BMD measured at Forearm Radius 33% is 0.706 g/cm2 with a T-score of -2.0. This patient is considered osteopenic/low bone mass according to [facility]) criteria. The quality of the exam is good. The quality of the exam is limited by patient condition. Patient has dementia, refused to lie down for exam. Patient does not meet criteria for FRAX assessment. Site Region Measured Date Measured Age YA BMD Significant CHANGE T-score Left Forearm Radius 33% 01/28/2022 84.6 -2.0 0.706 g/cm2 World Health Organization [facility]) criteria for post-menopausal, Caucasian Women: Normal       T-score at or above -1 SD Osteopenia   T-score between -1 and -2.5 SD Osteoporosis T-score at or below -2.5 SD RECOMMENDATION: 1. All patients should optimize calcium and vitamin D intake. 2. Consider FDA-approved medical therapies in postmenopausal women and men aged 40 years and older, based on the following: a. A hip or vertebral (clinical or morphometric) fracture. b. T-score = -2.5 at the femoral neck or spine after appropriate evaluation to exclude secondary causes. c. Low bone mass (T-score between -1.0 and -2.5 at the femoral neck or spine) and a  10-year probability of a hip fracture = 3% or a 10-year probability of a major osteoporosis-related fracture = 20% based on the [place]-adapted WHO algorithm. d. Clinician judgment and/or patient preferences may indicate treatment for people with 10-year fracture probabilities above or below these levels. FOLLOW-UP: Patients with diagnosis of osteoporosis or at high risk for fracture should have regular bone mineral density tests.? Patients eligible for Medicare are allowed routine testing every 2 years.? The testing frequency can be increased to one year for patients who have rapidly progressing disease, are receiving or discontinuing medical therapy to restore bone mass, or have additional risk factors. I have reviewed this study and agree with the findings. Mark A. Thornton Papas, M.D. [facility] Electronically Signed   By: Lavonia Dana M.D.   On: 01/28/2022 16:32    Assessment/Plan 1. Essential hypertension B/p well controlled  - continue on losartan   2. Mixed hyperlipidemia LDL at goal  - continue on rosuvastatin   3. Permanent atrial fibrillation (HCC) HR irregular  - continue on apixaban   4. Late onset Alzheimer's disease with behavioral disturbance ([place]) No new behavioral issues reported  - continue on memantine,donepezil,Seroquel and Ativan. - continue with supportive care    5. Age-related osteoporosis without current pathological fracture Continue on cal and vit D supplement. -  Ca Phosphate-Cholecalciferol (CALCIUM/VITAMIN D3 GUMMIES) 200-5 MG-MCG CHEW; Chew 200 mg by mouth 2 (two) times daily with a meal. Chew one by mouth twice daily.  Dispense: 90 tablet; Refill: 1  6. Candidiasis of breast Right breast and abdominal fold beefy redness noted. - keep skin folds dry  - start on Nystatin as below  - nystatin (MYCOSTATIN/NYSTOP) powder; Apply 1 application. topically 3 (three) times daily.  Dispense: 15 g; Refill: 0  7. Confusion Will rule out UTI  - Urine Culture - POC  Urinalysis Dipstick  8. Urinary frequency Afebrile  Rule out UTI  - Urine Culture - POC Urinalysis Dipstick  Family/ staff Communication: Reviewed plan of care with patient's daughter verbalized understanding  Labs/tests ordered:  - Urine Culture - POC Urinalysis Dipstick  Next Appointment : Return in about 6 months (around 08/21/2022) for medical mangement of chronic issues with Dani Gobble .   Sandrea Hughs, Stacey Dalton

## 2022-02-21 ENCOUNTER — Ambulatory Visit: Payer: Medicare Other | Admitting: Nurse Practitioner

## 2022-02-21 LAB — URINE CULTURE
MICRO NUMBER:: 13496003
SPECIMEN QUALITY:: ADEQUATE

## 2022-02-24 ENCOUNTER — Telehealth: Payer: Self-pay | Admitting: Family

## 2022-02-24 MED ORDER — SACCHAROMYCES BOULARDII 250 MG PO CAPS: 250.0000 mg | ORAL_CAPSULE | Freq: Two times a day (BID) | ORAL | 0 refills | Status: DC

## 2022-02-24 MED ORDER — CIPROFLOXACIN HCL 500 MG PO TABS: 500.0000 mg | ORAL_TABLET | Freq: Two times a day (BID) | ORAL | 0 refills | Status: DC

## 2022-02-24 NOTE — Telephone Encounter (Signed)
Labs with written order faxed to [facility] Fax: [phone]

## 2022-02-24 NOTE — Telephone Encounter (Signed)
[facility] request order for antibiotic given to pt today to be faxed to [phone].

## 2022-02-24 NOTE — Telephone Encounter (Signed)
Again, I'm not a provider. I don't order prescriptions for patients. This call should have been transferred to [place].M/CMA in Clinical Intake. Message routed to [place].M/CMA

## 2022-02-24 NOTE — Telephone Encounter (Signed)
Good morning Stacey Dalton, Just saw your message.sorry I've been out sick since last Thursday.I just reviewed the final urine culture results recommended to start on Cipro 500 mg tablet twice daily x 7  days  along with probiotics to prevent antibiotics associated diarrhea.

## 2022-02-24 NOTE — Telephone Encounter (Signed)
Final urine culture indicates > 100,000 colonies of E.coli start on Cipro 500 mg tablet one by mouth twice daily x 7 days   Take along with probiotics Florastor 250 mg capsule one by mouth twice daily x 10 days to prevent antibiotics associated diarrhea

## 2022-02-25 ENCOUNTER — Ambulatory Visit (INDEPENDENT_AMBULATORY_CARE_PROVIDER_SITE_OTHER): Payer: Medicare Other | Admitting: Nurse Practitioner

## 2022-02-25 ENCOUNTER — Encounter: Payer: Self-pay | Admitting: Nurse Practitioner

## 2022-02-25 DIAGNOSIS — Z0000 Encounter for general adult medical examination without abnormal findings: Secondary | ICD-10-CM

## 2022-02-25 DIAGNOSIS — Z66 Do not resuscitate: Secondary | ICD-10-CM | POA: Diagnosis not present

## 2022-02-25 MED ORDER — SACCHAROMYCES BOULARDII 250 MG PO CAPS: 250.0000 mg | ORAL_CAPSULE | Freq: Two times a day (BID) | ORAL | 0 refills | Status: DC

## 2022-02-25 MED ORDER — CIPROFLOXACIN HCL 500 MG PO TABS: 500.0000 mg | ORAL_TABLET | Freq: Two times a day (BID) | ORAL | 0 refills | Status: DC

## 2022-02-25 NOTE — Progress Notes (Signed)
   This service is provided via telemedicine  No vital signs collected/recorded due to the encounter was a telemedicine visit.   Location of patient (ex: home, work):  Home  Patient consents to a telephone visit: Yes, see telephone visit dated 02/25/22  Location of the provider (ex: office, home):  [facility], Office   Name of any referring provider:  N/A  Names of all persons participating in the telemedicine service and their role in the encounter:  S.Chrae B/CMA, Sherrie Mustache, NP, Mickel Baas (daughter), and Patient   Time spent on call:  9 min with medical assistant

## 2022-02-25 NOTE — Patient Instructions (Signed)
[name] , Thank you for taking time to come for your Medicare Wellness Visit. I appreciate your ongoing commitment to your health goals. Please review the following plan we discussed and let me know if I can assist you in the future.   Screening recommendations/referrals: Colonoscopy aged out Mammogram aged out Bone Density up to date Recommended yearly ophthalmology/optometry visit for glaucoma screening and checkup Recommended yearly dental visit for hygiene and checkup  Vaccinations: Influenza vaccine up to date Pneumococcal vaccine up to date Tdap vaccine up to date Shingles vaccine up to date    Advanced directives: on file.   Conditions/risks identified: progressive memory loss and functional decline. Advance age, obesity  Next appointment: yearly    Preventive Care 85 Years and Older, Female Preventive care refers to lifestyle choices and visits with your health care provider that can promote health and wellness. What does preventive care include? A yearly physical exam. This is also called an annual well check. Dental exams once or twice a year. Routine eye exams. Ask your health care provider how often you should have your eyes checked. Personal lifestyle choices, including: Daily care of your teeth and gums. Regular physical activity. Eating a healthy diet. Avoiding tobacco and drug use. Limiting alcohol use. Practicing safe sex. Taking low-dose aspirin every day. Taking vitamin and mineral supplements as recommended by your health care provider. What happens during an annual well check? The services and screenings done by your health care provider during your annual well check will depend on your age, overall health, lifestyle risk factors, and family history of disease. Counseling  Your health care provider may ask you questions about your: Alcohol use. Tobacco use. Drug use. Emotional well-being. Home and relationship well-being. Sexual  activity. Eating habits. History of falls. Memory and ability to understand (cognition). Work and work Astronomer. Reproductive health. Screening  You may have the following tests or measurements: Height, weight, and BMI. Blood pressure. Lipid and cholesterol levels. These may be checked every 5 years, or more frequently if you are over 57 years old. Skin check. Lung cancer screening. You may have this screening every year starting at age 11 if you have a 30-pack-year history of smoking and currently smoke or have quit within the past 15 years. Fecal occult blood test (FOBT) of the stool. You may have this test every year starting at age 12. Flexible sigmoidoscopy or colonoscopy. You may have a sigmoidoscopy every 5 years or a colonoscopy every 10 years starting at age 19. Hepatitis C blood test. Hepatitis B blood test. Sexually transmitted disease (STD) testing. Diabetes screening. This is done by checking your blood sugar (glucose) after you have not eaten for a while (fasting). You may have this done every 1-3 years. Bone density scan. This is done to screen for osteoporosis. You may have this done starting at age 71. Mammogram. This may be done every 1-2 years. Talk to your health care provider about how often you should have regular mammograms. Talk with your health care provider about your test results, treatment options, and if necessary, the need for more tests. Vaccines  Your health care provider may recommend certain vaccines, such as: Influenza vaccine. This is recommended every year. Tetanus, diphtheria, and acellular pertussis (Tdap, Td) vaccine. You may need a Td booster every 10 years. Zoster vaccine. You may need this after age 60. Pneumococcal 13-valent conjugate (PCV13) vaccine. One dose is recommended after age 76. Pneumococcal polysaccharide (PPSV23) vaccine. One dose is recommended after age 80.  Talk to your health care provider about which screenings and vaccines  you need and how often you need them. This information is not intended to replace advice given to you by your health care provider. Make sure you discuss any questions you have with your health care provider. Document Released: 09/28/2015 Document Revised: 05/21/2016 Document Reviewed: 07/03/2015 Elsevier Interactive Patient Education  2017 [place] Prevention in the Home Falls can cause injuries. They can happen to people of all ages. There are many things you can do to make your home safe and to help prevent falls. What can I do on the outside of my home? Regularly fix the edges of walkways and driveways and fix any cracks. Remove anything that might make you trip as you walk through a door, such as a raised step or threshold. Trim any bushes or trees on the path to your home. Use bright outdoor lighting. Clear any walking paths of anything that might make someone trip, such as rocks or tools. Regularly check to see if handrails are loose or broken. Make sure that both sides of any steps have handrails. Any raised decks and porches should have guardrails on the edges. Have any leaves, snow, or ice cleared regularly. Use sand or salt on walking paths during winter. Clean up any spills in your garage right away. This includes oil or grease spills. What can I do in the bathroom? Use night lights. Install grab bars by the toilet and in the tub and shower. Do not use towel bars as grab bars. Use non-skid mats or decals in the tub or shower. If you need to sit down in the shower, use a plastic, non-slip stool. Keep the floor dry. Clean up any water that spills on the floor as soon as it happens. Remove soap buildup in the tub or shower regularly. Attach bath mats securely with double-sided non-slip rug tape. Do not have throw rugs and other things on the floor that can make you trip. What can I do in the bedroom? Use night lights. Make sure that you have a light by your bed that  is easy to reach. Do not use any sheets or blankets that are too big for your bed. They should not hang down onto the floor. Have a firm chair that has side arms. You can use this for support while you get dressed. Do not have throw rugs and other things on the floor that can make you trip. What can I do in the kitchen? Clean up any spills right away. Avoid walking on wet floors. Keep items that you use a lot in easy-to-reach places. If you need to reach something above you, use a strong step stool that has a grab bar. Keep electrical cords out of the way. Do not use floor polish or wax that makes floors slippery. If you must use wax, use non-skid floor wax. Do not have throw rugs and other things on the floor that can make you trip. What can I do with my stairs? Do not leave any items on the stairs. Make sure that there are handrails on both sides of the stairs and use them. Fix handrails that are broken or loose. Make sure that handrails are as long as the stairways. Check any carpeting to make sure that it is firmly attached to the stairs. Fix any carpet that is loose or worn. Avoid having throw rugs at the top or bottom of the stairs. If you do have throw  rugs, attach them to the floor with carpet tape. Make sure that you have a light switch at the top of the stairs and the bottom of the stairs. If you do not have them, ask someone to add them for you. What else can I do to help prevent falls? Wear shoes that: Do not have high heels. Have rubber bottoms. Are comfortable and fit you well. Are closed at the toe. Do not wear sandals. If you use a stepladder: Make sure that it is fully opened. Do not climb a closed stepladder. Make sure that both sides of the stepladder are locked into place. Ask someone to hold it for you, if possible. Clearly mark and make sure that you can see: Any grab bars or handrails. First and last steps. Where the edge of each step is. Use tools that help you  move around (mobility aids) if they are needed. These include: Canes. Walkers. Scooters. Crutches. Turn on the lights when you go into a dark area. Replace any light bulbs as soon as they burn out. Set up your furniture so you have a clear path. Avoid moving your furniture around. If any of your floors are uneven, fix them. If there are any pets around you, be aware of where they are. Review your medicines with your doctor. Some medicines can make you feel dizzy. This can increase your chance of falling. Ask your doctor what other things that you can do to help prevent falls. This information is not intended to replace advice given to you by your health care provider. Make sure you discuss any questions you have with your health care provider. Document Released: 06/28/2009 Document Revised: 02/07/2016 Document Reviewed: 10/06/2014 Elsevier Interactive Patient Education  2017 [employer].

## 2022-02-25 NOTE — Telephone Encounter (Signed)
I have faxed the order 3 times to 2 different fax numbers to [facility] and the fax keeps coming back. I spoke with Eugenia at the facility [phone] and she stated that she is going to get Dow Adolph or Joni Reining to call me to get verbal orders. I am awaiting their call.

## 2022-02-25 NOTE — Addendum Note (Signed)
Addended by: Nelda Severe A on: 02/25/2022 08:49 AM   Modules accepted: Orders

## 2022-02-25 NOTE — Progress Notes (Signed)
Subjective:   Stacey Dalton is a 87 y.o. female who presents for Medicare Annual (Subsequent) preventive examination.  Review of Systems     Cardiac Risk Factors include: sedentary lifestyle;advanced age (>92men, >40 women);hypertension     Objective:    There were no vitals filed for this visit. There is no height or weight on file to calculate BMI.     02/25/2022    3:30 PM 02/19/2022    3:52 PM 10/28/2021    3:59 PM 07/22/2021    7:25 AM 03/20/2021    9:21 AM 02/19/2021    3:56 PM 12/03/2020    6:44 AM  Advanced Directives  Does Patient Have a Medical Advance Directive? Yes No Yes No  Yes No  Type of Advance Directive Out of facility DNR (pink MOST or yellow form)  Out of facility DNR (pink MOST or yellow form)  Out of facility DNR (pink MOST or yellow form) Out of facility DNR (pink MOST or yellow form)   Does patient want to make changes to medical advance directive? No - Patient declined     No - Patient declined   Would patient like information on creating a medical advance directive?  No - Patient declined  No - Patient declined   No - Patient declined  Pre-existing out of facility DNR order (yellow form or pink MOST form) Yellow form placed in chart (order not valid for inpatient use)     Yellow form placed in chart (order not valid for inpatient use)     Current Medications (verified) Outpatient Encounter Medications as of 02/25/2022  Medication Sig   acetaminophen (TYLENOL) 160 MG/5ML liquid Take 30 mLs by mouth as needed for fever.   apixaban (ELIQUIS) 5 MG TABS tablet Take 1 tablet (5 mg total) by mouth 2 (two) times daily.   Ca Phosphate-Cholecalciferol (CALCIUM/VITAMIN D3 GUMMIES) 200-5 MG-MCG CHEW Chew 200 mg by mouth 2 (two) times daily with a meal. Chew one by mouth twice daily.   ciprofloxacin (CIPRO) 500 MG tablet Take 1 tablet (500 mg total) by mouth 2 (two) times daily. For 7 days.   cyanocobalamin 1000 MCG tablet Take 1,000 mcg by mouth in the morning.    donepezil (ARICEPT) 10 MG tablet TAKE 1 TABLET EVERY EVENING   LORazepam (ATIVAN) 0.5 MG tablet Take one tablet by mouth once daily as needed for anxiety.   losartan (COZAAR) 50 MG tablet TAKE 1 TABLET DAILY   memantine (NAMENDA) 5 MG tablet TAKE 1 TABLET TWICE A DAY   nystatin (MYCOSTATIN/NYSTOP) powder Apply 1 application  topically 3 (three) times daily as needed.   QUEtiapine (SEROQUEL) 25 MG tablet TAKE 1 TABLET AT BEDTIME   rosuvastatin (CRESTOR) 20 MG tablet TAKE 1 TABLET DAILY   saccharomyces boulardii (FLORASTOR) 250 MG capsule Take 1 capsule (250 mg total) by mouth 2 (two) times daily. For 10 days   [DISCONTINUED] Acetaminophen (ACETAMINOPHEN EXTRA STRENGTH) 167 MG/5ML LIQD Take 30 mLs (1,002 mg total) by mouth 2 (two) times daily.   [DISCONTINUED] nystatin (MYCOSTATIN/NYSTOP) powder Apply 1 application. topically 3 (three) times daily. (Patient not taking: Reported on 02/25/2022)   [DISCONTINUED] nystatin cream (MYCOSTATIN) Apply 1 application topically 4 (four) times daily as needed (abdominal skin fold rash). (Patient not taking: Reported on 02/25/2022)   No facility-administered encounter medications on file as of 02/25/2022.    Allergies (verified) Chamomile flowers [chamomile], Lisinopril, Mixed ragweed, and Other   History: Past Medical History:  Diagnosis Date   A-fib (  [place])    Allergic rhinitis 10/21/2010   Back pain 10/21/2010   Chronic back pain 10/21/2010   Colonic polyp 10/21/2018   Dementia ([place])    Diabetes mellitus without complication ([place])    Echocardiogram abnormal 11/09/2018   Epigastric abdominal pain    History of recent travel    Hyperlipidemia    Hypertension    Incontinence    Mixed hyperlipidemia    S/P cholecystectomy 10/21/2010   Past Surgical History:  Procedure Laterality Date   ABDOMINAL HYSTERECTOMY     partial due to prolapse   BACK SURGERY  2007   CATARACT EXTRACTION Left 06/26/2014   CHOLECYSTECTOMY     melanoma resection Right 1980    Family History  Problem Relation Age of Onset   Cancer Mother    Dementia Father        Natural Death   Hodgkin's lymphoma Son    Melanoma Son        Seizures   Social History   Socioeconomic History   Marital status: Widowed    Spouse name: Not on file   Number of children: 5   Years of education: Not on file   Highest education level: Not on file  Occupational History   Not on file  Tobacco Use   Smoking status: Former   Smokeless tobacco: Never   Tobacco comments:    Only smoked socially.   Vaping Use   Vaping Use: Never used  Substance and Sexual Activity   Alcohol use: Not Currently   Drug use: Never   Sexual activity: Not Currently  Other Topics Concern   Not on file  Social History Narrative   ** Merged History Encounter **       Diet: Standard -Minimize   Sugar (Carbs)    Do you drink/ eat things with caffeine?  Yes Caffeinated Hot Tea  Marital status:     Widow                          What year were you married ? 1958  Do you live in a house, apartment,assistred living,    condo, trailer, etc.)?  House  Is it one or more stories? [place]  How many persons live in your home ?  2  Do you have any pets in your home ?(please list)   No  Highest Level of education completed:  Therapist, occupational   Current or pa   st profession: Homemaker  Do you exercise?     No                         Type & how often   ADVANCED DIRECTIVES (Please bring copies)  Do you have a living will? No  Do you have a DNR form?   No                    If not, do you want to discuss    one?   Do you have signed POA?HPOA forms?   No              If so, please bring to your appointment  FUNCTIONAL STATUS- To be completed by Spouse / child / Staff  (Child)  Do you have difficulty bathing or dressing yourself ?  Yes  Do you have di   fficulty preparing food or eating ?  Yes  Do you  have difficulty managing your mediation ?  Yes  Do you have difficulty  managing your finances ?  Yes  Do you have difficulty affording your medication ?  No     Social Determinants of Radio broadcast assistant Strain: Not on file  Food Insecurity: Not on file  Transportation Needs: Not on file  Physical Activity: Not on file  Stress: Not on file  Social Connections: Not on file    Tobacco Counseling Counseling given: Not Answered Tobacco comments: Only smoked socially.    Clinical Intake:  Pre-visit preparation completed: Yes        BMI - recorded: 34 Nutritional Status: BMI > 30  Obese Nutritional Risks: None Diabetes: Yes  How often do you need to have someone help you when you read instructions, pamphlets, or other written materials from your doctor or pharmacy?: 5 - Always  Diabetic?yes         Activities of Daily Living    02/25/2022    3:36 PM  In your present state of health, do you have any difficulty performing the following activities:  Hearing? 0  Vision? 0  Difficulty concentrating or making decisions? 1  Walking or climbing stairs? 1  Comment does not climb stairs  Dressing or bathing? 1  Doing errands, shopping? 1  Preparing Food and eating ? Y  Comment does not prepare food  Using the Toilet? Y  In the past six months, have you accidently leaked urine? Y  Do you have problems with loss of bowel control? Y  Managing your Medications? Y  Managing your Finances? Y  Housekeeping or managing your Housekeeping? Y    Patient Care Team: Lauree Chandler, NP as PCP - General (Geriatric Medicine) Lauree Chandler, NP (Geriatric Medicine)  Indicate any recent Medical Services you may have received from other than [facility] providers in the past year (date may be approximate).     Assessment:   This is a routine wellness examination for [place].  Hearing/Vision screen Hearing Screening - Comments:: No hearing issues  Vision Screening - Comments:: Last eye exam, greater than 12 months ago. Per daughter due to  memory concerns full eye exam not warranted.   Dietary issues and exercise activities discussed: Current Exercise Habits: The patient does not participate in regular exercise at present   Goals Addressed   None    Depression Screen    02/25/2022    3:26 PM 02/19/2021    3:54 PM 10/18/2020    3:42 PM 02/06/2020    3:37 PM 12/22/2019    1:41 PM  PHQ 2/9 Scores  PHQ - 2 Score 0 0 0 0 0    Fall Risk    02/25/2022    3:24 PM 02/19/2022    3:52 PM 10/28/2021    3:57 PM 02/19/2021    3:55 PM 12/10/2020    3:29 PM  [place] in the past year? 1 0 1 1 1   Number falls in past yr: 0 0 1 1 1   Injury with Fall? 1 0 1 1 0  Comment Yes in November 2022      Risk for fall due to : History of fall(s) No Fall Risks History of fall(s)  History of fall(s);Impaired balance/gait  Follow up Falls evaluation completed Falls evaluation completed Falls evaluation completed      FALL RISK PREVENTION PERTAINING TO THE HOME:  Any stairs in or around the home? No  If so, are there  any without handrails?  na Home free of loose throw rugs in walkways, pet beds, electrical cords, etc? Yes  Adequate lighting in your home to reduce risk of falls? Yes   ASSISTIVE DEVICES UTILIZED TO PREVENT FALLS:  Life alert? No  Use of a cane, walker or w/c? Yes  Grab bars in the bathroom? Yes  Shower chair or bench in shower? Yes  Elevated toilet seat or a handicapped toilet? Yes   TIMED UP AND GO:  Was the test performed? No .    Cognitive Function:        Immunizations Immunization History  Administered Date(s) Administered   Influenza, High Dose Seasonal PF 06/28/2012, 08/01/2013, 06/12/2014, 07/23/2015, 07/08/2016, 06/15/2017, 07/12/2019, 06/10/2021   Influenza,inj,Quad PF,6+ Mos 06/01/2018   Influenza-Unspecified 07/04/2009, 06/26/2011, 06/11/2020   Moderna Sars-Covid-2 Vaccination 10/04/2019, 10/27/2019, 07/13/2020   PPD Test 03/02/2020   Pfizer Covid-19 Vaccine Bivalent Booster 55yrs & up  05/30/2021   Pneumococcal Conjugate-13 06/12/2014   Pneumococcal Polysaccharide-23 08/15/2000, 09/15/2008   Td 09/15/2004   Tdap 12/09/2019   Zoster Recombinat (Shingrix) 06/16/2011, 07/31/2011    TDAP status: Up to date  Flu Vaccine status: Up to date  Pneumococcal vaccine status: Up to date  Covid-19 vaccine status: Completed vaccines  Qualifies for Shingles Vaccine? Yes   Zostavax completed Yes   Shingrix Completed?: Yes  Screening Tests Health Maintenance  Topic Date Due   COVID-19 Vaccine (5 - Moderna series) 09/29/2021   INFLUENZA VACCINE  04/15/2022   TETANUS/TDAP  12/08/2029   Pneumonia Vaccine 1+ Years old  Completed   DEXA SCAN  Completed   Zoster Vaccines- Shingrix  Completed   HPV VACCINES  Aged Out    Health Maintenance  Health Maintenance Due  Topic Date Due   COVID-19 Vaccine (5 - Moderna series) 09/29/2021    Colorectal cancer screening: No longer required.   Mammogram status: No longer required due to age.  Bone Density status: Completed 01/2022. Results reflect: Bone density results: OSTEOPOROSIS. Repeat every 2 years.  Lung Cancer Screening: (Low Dose CT Chest recommended if Age 92-80 years, 30 pack-year currently smoking OR have quit w/in 15years.) does not qualify.   Lung Cancer Screening Referral: na  Additional Screening:  Hepatitis C Screening: does not qualify  Vision Screening: Recommended annual ophthalmology exams for early detection of glaucoma and other disorders of the eye. Is the patient up to date with their annual eye exam?  No  Who is the provider or what is the name of the office in which the patient attends annual eye exams? Does not see eye doctor. If pt is not established with a provider, would they like to be referred to a provider to establish care? No .   Dental Screening: Recommended annual dental exams for proper oral hygiene  Community Resource Referral / Chronic Care Management: CRR required this visit?  No    CCM required this visit?  No      Plan:     I have personally reviewed and noted the following in the patient's chart:   Medical and social history Use of alcohol, tobacco or illicit drugs  Current medications and supplements including opioid prescriptions.  Functional ability and status Nutritional status Physical activity Advanced directives List of other physicians Hospitalizations, surgeries, and ER visits in previous 12 months Vitals Screenings to include cognitive, depression, and falls Referrals and appointments  In addition, I have reviewed and discussed with patient certain preventive protocols, quality metrics, and best practice recommendations. A written  personalized care plan for preventive services as well as general preventive health recommendations were provided to patient.     Lauree Chandler, NP   02/25/2022    Virtual Visit via Telephone Note  I connected with patient 02/25/22 at  3:20 PM EDT by telephone and verified that I am speaking with the correct person using two identifiers.  Location: Patient: home Provider: [facility]   I discussed the limitations, risks, security and privacy concerns of performing an evaluation and management service by telephone and the availability of in person appointments. I also discussed with the patient that there may be a patient responsible charge related to this service. The patient expressed understanding and agreed to proceed.   I discussed the assessment and treatment plan with the patient. The patient was provided an opportunity to ask questions and all were answered. The patient agreed with the plan and demonstrated an understanding of the instructions.   The patient was advised to call back or seek an in-person evaluation if the symptoms worsen or if the condition fails to improve as anticipated.  I provided 16 minutes of non-face-to-face time during this encounter.  Carlos American. Harle Battiest Avs printed  and mailed

## 2022-02-25 NOTE — Telephone Encounter (Signed)
Eugenia with [facility] called and stated to send the Rx to [facility] and state on the Rx "PROFILE ONLY"  Stated that they can take the order that way since the fax is not going through to the facility. Stated that with Profile Only they will only fax a copy of the Rx and will not refill it and it will be on the patient's profile and they can administer.   Faxed as requested.

## 2022-02-27 ENCOUNTER — Ambulatory Visit: Payer: Medicare Other | Admitting: Nurse Practitioner

## 2022-03-17 ENCOUNTER — Ambulatory Visit: Payer: Medicare Other

## 2022-03-18 ENCOUNTER — Other Ambulatory Visit: Payer: Self-pay | Admitting: Family

## 2022-03-21 ENCOUNTER — Telehealth: Payer: Self-pay

## 2022-03-21 NOTE — Telephone Encounter (Signed)
Patient's daughter calling to get help with getting an order for a "toilet seat lift" for patient's toilet in her apartment at facility. She isn't sure if she goes through [facility] or what. Staff is recommending it.

## 2022-03-24 ENCOUNTER — Other Ambulatory Visit: Payer: Self-pay | Admitting: Adult Health

## 2022-03-24 DIAGNOSIS — G301 Alzheimer's disease with late onset: Secondary | ICD-10-CM

## 2022-03-24 NOTE — Telephone Encounter (Signed)
Patient daughter Stacey Dalton called and states that nobody called her back Friday. I told her that we most likely were all out of office by the time reply was made. I informed her what NP Synthia Innocent stated "One can be ordered through her medicare provider .Marland Kitchen However daughter states that she's not sure how to handle this process. She doesn't know what to do or how to get medicare provider to order this. Message routed to NP, and Synetta Fail May/CMA to advise me on what to tell patient.

## 2022-03-24 NOTE — Telephone Encounter (Signed)
I don't know how to do that. Writing orders is something providers do. Patient daughter [name] states that we need to write the order and she will pick it up from [place]. Then she states she will give to medical supply store. Patient has Prolia injection 03/26/2022.    Sharee Holster, NP  You 8 minutes ago (2:20 PM)    Tell the daughter to use the number on back of her insurance card. If she needs an order for this equipment please write one for her and I will sign

## 2022-03-24 NOTE — Telephone Encounter (Signed)
I found it and I printed it out. Placed in the front for administration to give when daughter comes to pick order up.

## 2022-03-26 ENCOUNTER — Ambulatory Visit (INDEPENDENT_AMBULATORY_CARE_PROVIDER_SITE_OTHER): Payer: Medicare Other | Admitting: *Deleted

## 2022-03-26 DIAGNOSIS — M810 Age-related osteoporosis without current pathological fracture: Secondary | ICD-10-CM

## 2022-03-26 MED ORDER — DENOSUMAB 60 MG/ML ~~LOC~~ SOSY
60.0000 mg | PREFILLED_SYRINGE | Freq: Once | SUBCUTANEOUS | Status: AC
  Administered 2022-03-26: 60 mg via SUBCUTANEOUS

## 2022-04-03 NOTE — Addendum Note (Signed)
Addended by: Maurice Small on: 04/03/2022 11:37 AM   Modules accepted: Orders

## 2022-04-11 ENCOUNTER — Telehealth: Payer: Self-pay

## 2022-04-11 NOTE — Telephone Encounter (Signed)
Would recommend an office visit to discuss all her concerns. Could be do to disease progression with the dementia or something else going on.  Some of her medication can causes sleepiness. Will need evaluation before placing on lasix.

## 2022-04-11 NOTE — Telephone Encounter (Signed)
Appointment scheduled 04/21/22
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
## 2022-04-14 ENCOUNTER — Ambulatory Visit (INDEPENDENT_AMBULATORY_CARE_PROVIDER_SITE_OTHER): Payer: Medicare Other | Admitting: Adult Health

## 2022-04-14 ENCOUNTER — Encounter: Payer: Self-pay | Admitting: Adult Health

## 2022-04-14 VITALS — BP 140/86 | HR 84 | Temp 96.9°F | Resp 18 | Ht 64.0 in | Wt 201.0 lb

## 2022-04-14 DIAGNOSIS — E669 Obesity, unspecified: Secondary | ICD-10-CM

## 2022-04-14 DIAGNOSIS — F02818 Dementia in other diseases classified elsewhere, unspecified severity, with other behavioral disturbance: Secondary | ICD-10-CM

## 2022-04-14 DIAGNOSIS — G301 Alzheimer's disease with late onset: Secondary | ICD-10-CM | POA: Diagnosis not present

## 2022-04-14 DIAGNOSIS — E66811 Obesity, class 1: Secondary | ICD-10-CM

## 2022-04-14 DIAGNOSIS — R410 Disorientation, unspecified: Secondary | ICD-10-CM | POA: Diagnosis not present

## 2022-04-14 DIAGNOSIS — R4689 Other symptoms and signs involving appearance and behavior: Secondary | ICD-10-CM | POA: Diagnosis not present

## 2022-04-14 DIAGNOSIS — N3000 Acute cystitis without hematuria: Secondary | ICD-10-CM

## 2022-04-14 LAB — POCT URINALYSIS DIPSTICK
Glucose, UA: NEGATIVE
Ketones, UA: NEGATIVE
Nitrite, UA: POSITIVE
Protein, UA: POSITIVE — AB
Spec Grav, UA: 1.025 (ref 1.010–1.025)
Urobilinogen, UA: 1 E.U./dL
pH, UA: 6 (ref 5.0–8.0)

## 2022-04-14 MED ORDER — QUETIAPINE FUMARATE 25 MG PO TABS: 12.5000 mg | ORAL_TABLET | Freq: Every day | ORAL | 3 refills | Status: DC

## 2022-04-14 NOTE — Progress Notes (Unsigned)
[facility] clinic  Provider:   Code Status: *** Goals of Care:     02/25/2022    3:30 PM  Advanced Directives  Does Patient Have a Medical Advance Directive? Yes  Type of Advance Directive Out of facility DNR (pink MOST or yellow form)  Does patient want to make changes to medical advance directive? No - Patient declined  Pre-existing out of facility DNR order (yellow form or pink MOST form) Yellow form placed in chart (order not valid for inpatient use)     Chief Complaint  Patient presents with   Acute Visit    Patient is here for behavioral health concerns, aggressive , and retaining fluids    HPI: Patient is a 87 y.o. female seen today for an acute visit for  Past Medical History:  Diagnosis Date   A-fib (HCC)    Allergic rhinitis 10/21/2010   Back pain 10/21/2010   Chronic back pain 10/21/2010   Colonic polyp 10/21/2018   Dementia (HCC)    Diabetes mellitus without complication (HCC)    Echocardiogram abnormal 11/09/2018   Epigastric abdominal pain    History of recent travel    Hyperlipidemia    Hypertension    Incontinence    Mixed hyperlipidemia    S/P cholecystectomy 10/21/2010    Past Surgical History:  Procedure Laterality Date   ABDOMINAL HYSTERECTOMY     partial due to prolapse   BACK SURGERY  2007   CATARACT EXTRACTION Left 06/26/2014   CHOLECYSTECTOMY     melanoma resection Right 1980    Allergies  Allergen Reactions   Chamomile Flowers [Chamomile]    Lisinopril    Mixed Ragweed    Other     Seasonal allergies, cats    Outpatient Encounter Medications as of 04/14/2022  Medication Sig   acetaminophen (TYLENOL) 160 MG/5ML liquid Take 30 mLs by mouth as needed for fever.   apixaban (ELIQUIS) 5 MG TABS tablet Take 1 tablet (5 mg total) by mouth 2 (two) times daily.   Ca Phosphate-Cholecalciferol (CALCIUM/VITAMIN D3 GUMMIES) 200-5 MG-MCG CHEW Chew 200 mg by mouth 2 (two) times daily with a meal. Chew one by mouth twice daily.   ciprofloxacin  (CIPRO) 500 MG tablet Take 1 tablet (500 mg total) by mouth 2 (two) times daily. For 7 days.   cyanocobalamin 1000 MCG tablet Take 1,000 mcg by mouth in the morning.   donepezil (ARICEPT) 10 MG tablet TAKE 1 TABLET EVERY EVENING   LORazepam (ATIVAN) 0.5 MG tablet Take one tablet by mouth once daily as needed for anxiety.   losartan (COZAAR) 50 MG tablet TAKE 1 TABLET DAILY   memantine (NAMENDA) 5 MG tablet TAKE 1 TABLET TWICE A DAY   nystatin (MYCOSTATIN/NYSTOP) powder APPLY 1 APPLICATION TOPICALLY THREE TIMES A DAY   QUEtiapine (SEROQUEL) 25 MG tablet TAKE 1 TABLET AT BEDTIME   rosuvastatin (CRESTOR) 20 MG tablet TAKE 1 TABLET DAILY   saccharomyces boulardii (FLORASTOR) 250 MG capsule Take 1 capsule (250 mg total) by mouth 2 (two) times daily. For 10 days   No facility-administered encounter medications on file as of 04/14/2022.    Review of Systems:  Review of Systems  Health Maintenance  Topic Date Due   COVID-19 Vaccine (5 - Moderna series) 09/29/2021   INFLUENZA VACCINE  04/15/2022   TETANUS/TDAP  12/08/2029   Pneumonia Vaccine 67+ Years old  Completed   DEXA SCAN  Completed   Zoster Vaccines- Shingrix  Completed   HPV VACCINES  Aged Out  Physical Exam: Vitals:   04/14/22 1452  BP: 140/86  Pulse: 84  Resp: 18  Temp: (!) 96.9 F (36.1 C)  TempSrc: Temporal  SpO2: 95%  Weight: 201 lb (91.2 kg)  Height: 5\' 4"  (1.626 m)   Body mass index is 34.5 kg/m. Physical Exam  Labs reviewed: Basic Metabolic Panel: Recent Labs    10/28/21 1635  NA 142  K 4.2  CL 107  CO2 24  GLUCOSE 91  BUN 21  CREATININE 0.60  CALCIUM 9.5   Liver Function Tests: Recent Labs    10/28/21 1635  AST 16  ALT 15  BILITOT 0.5  PROT 6.8   No results for input(s): "LIPASE", "AMYLASE" in the last 8760 hours. No results for input(s): "AMMONIA" in the last 8760 hours. CBC: Recent Labs    10/28/21 1635  WBC 7.5  NEUTROABS 5,070  HGB 13.5  HCT 40.5  MCV 91.4  PLT 225   Lipid  Panel: Recent Labs    10/28/21 1635  CHOL 133  HDL 40*  LDLCALC 73  TRIG 10/30/21  CHOLHDL 3.3   Lab Results  Component Value Date   HGBA1C 5.9 10/04/2018    Procedures since last visit: No results found.  Assessment/Plan    Labs/tests ordered:  * No order type specified * Next appt:  08/29/2022

## 2022-04-14 NOTE — Patient Instructions (Signed)
Alzheimer's Disease Alzheimer's disease is a brain disease that affects memory, thinking, language, and behavior. People with Alzheimer's disease lose mental abilities, and the disease gets worse over time. Alzheimer's disease is a form of dementia. What are the causes? This condition develops when a protein called beta-amyloid forms deposits in the brain. It is not known what causes these deposits to form. Alzheimer's disease may also be caused by a gene mutation that is inherited from one parent or both parents. A gene mutation is a harmful change in a gene. Not everyone who inherits the genetic mutation will get the disease. What increases the risk? You are more likely to develop this condition if you: Are older than age 65. Are female. Have any of these medical conditions: High blood pressure. Diabetes. Heart or blood vessel disease. Smoke. Have obesity. Have had a brain injury. Have had a stroke. Have a family history of dementia. What are the signs or symptoms? Symptoms of this condition may happen in three stages, which often overlap. Early stage In this stage, you may continue to be independent. You may still be able to drive, work, and be social. Symptoms in this stage include: Minor memory problems, such as forgetting a name, words, or what you did recently. Difficulty with: Paying attention. Communicating. Doing familiar tasks. Problem solving or doing calculations. Following instructions. Learning new things. Anxiety. Social withdrawal. Loss of motivation. Moderate stage In this stage, you will start to need care. Symptoms in this stage include: Difficulty with expressing thoughts. Memory loss that affects daily life. This can include forgetting: Recent events that have happened. If you have taken medicines or eaten. Familiar places. You may get lost while walking or driving. To pay bills or manage finances. Personal hygiene such as bathing or using the  bathroom. Confusion about where you are or what time it is. Difficulty in judging distance. Changes in personality, mood, and behavior. You may be moody, irritable, angry, frustrated, fearful, anxious, or suspicious. Poor reasoning and judgment. Delusions or hallucinations. Changes in sleep patterns. Severe stage In the final stage, you will need help with your personal care and daily activities. Symptoms in this stage include: Worsening memory loss. Personality changes. Loss of awareness of your surroundings. Changes in physical abilities, including the ability to walk, sit, and swallow. Difficulty in communicating. Inability to control your bladder and bowels. Increasing confusion. Increasing behavior changes. How is this diagnosed?  This condition is diagnosed by a health care provider who specializes in diseases of the nervous system (neurologist) or one who specializes in care of the elderly (geriatrician or geriatric psychiatrist). Other causes of dementia may also be ruled out. Your health care provider will talk with you and your family, friends, or caregivers about your history and symptoms. A thorough medical history will be taken, and you will have a physical exam and tests. Tests may include: Lab tests, such as blood or urine tests. Imaging tests, such as a CT scan, a PET scan, or an MRI. A lumbar puncture. This test involves removing and testing a small amount of the fluid that surrounds the brain and spinal cord. An electroencephalogram (EEG). In this test, small metal discs are used to measure electrical activity in the brain. Memory tests, cognitive tests, and neuropsychological tests. These tests evaluate brain function. Genetic testing. This may be done if you have early onset of the disease (before age 60) or if other family members have the disease. How is this treated? At this time, there is   no treatment to cure Alzheimer's disease or stop it from getting worse. The  goals of treatment are: To manage behavioral changes. To provide you with a safe environment. To help manage daily life for you and your caregivers. The following treatment options are available: Medicines. Medicines may help the memory work better and manage behavioral symptoms. Cognitive therapy. Cognitive therapy provides you with education, support, and memory aids. It is most helpful in the early stages of the condition. Counseling or spiritual guidance. It is normal to have a lot of feelings, including anger, relief, fear, and isolation. Counseling and guidance can help you deal with these feelings. Caregiving. This involves having caregivers help you with your daily activities. Family support groups. These provide education, emotional support, and information about community resources to family members who are taking care of you. Follow these instructions at home:  Medicines Take over-the-counter and prescription medicines only as told by your health care provider. Use a pill organizer or pill reminder to help you manage your medicines. Avoid taking medicines that can affect thinking, such as pain medicines or sleeping medicines. Lifestyle Make healthy lifestyle choices: Be physically active as told by your health care provider. Regular exercise may help improve symptoms. Do not use any products that contain nicotine or tobacco, such as cigarettes, e-cigarettes, and chewing tobacco. If you need help quitting, ask your health care provider. Do not drink alcohol. Eat a healthy diet. Practice stress-management techniques when you get stressed. Stay social. Drink enough fluid to keep your urine pale yellow. Make sure to get quality sleep. Avoid taking long naps during the day. Take short naps of 30 minutes or less if needed. Keep your sleeping area dark and cool. Avoid exercising during the few hours before you go to bed. Avoid caffeine products in the afternoon and evening. General  instructions Work with your health care provider to determine what you need help with and what your safety needs are. If you were given a bracelet that identifies you as a person with memory loss or tracks your location, make sure to wear it at all times. Talk with your health care provider about whether it is safe for you to drive. Work with your family to make important decisions, such as advance directives, medical power of attorney, or a living will. Keep all follow-up visits. This is important. Where to find more information The Alzheimer's Association: Call the 24-hour helpline at 1-800-272-3900, or visit www.alz.org Contact a health care provider if: You have nausea, vomiting, or trouble with eating related to a medicine. You have worsening mood or behavior changes, such as depression, anxiety, or hallucinations. You or your family members become concerned for your safety. Get help right away if: You become less responsive or are difficult to wake up. Your memory suddenly gets worse. You feel that you want to harm yourself. If you ever feel like you may hurt yourself or others, or have thoughts about taking your own life, get help right away. Go to your nearest emergency department or: Call your local emergency services (911 in the [place] Call a suicide crisis helpline, such as the National Suicide Prevention Lifeline at 1-800-273-8255 or 988 in the [place] This is open 24 hours a day in the [place] Text the Crisis Text Line at 741741 (in the [place] Summary Alzheimer's disease is a brain disease that affects memory, thinking, language, and behavior. Alzheimer's disease is a form of dementia. This condition is diagnosed by a specialist in diseases of the nervous   system (neurologist) or one who specializes in care of the elderly. At this time, there is no treatment to cure Alzheimer's disease or stop it from getting worse. The goal of treatment is to help you manage any symptoms. Work with  your family to make important decisions, such as advance directives, medical power of attorney, or a living will. This information is not intended to replace advice given to you by your health care provider. Make sure you discuss any questions you have with your health care provider. Document Revised: 03/27/2021 Document Reviewed: 12/19/2019 Elsevier Patient Education  2023 Elsevier Inc.  

## 2022-04-16 LAB — URINE CULTURE
MICRO NUMBER:: 13716421
SPECIMEN QUALITY:: ADEQUATE

## 2022-04-16 MED ORDER — AMOXICILLIN-POT CLAVULANATE 875-125 MG PO TABS: 1.0000 | ORAL_TABLET | Freq: Two times a day (BID) | ORAL | 0 refills | Status: AC

## 2022-04-16 MED ORDER — SACCHAROMYCES BOULARDII 250 MG PO CAPS: 250.0000 mg | ORAL_CAPSULE | Freq: Two times a day (BID) | ORAL | 0 refills | Status: AC

## 2022-04-16 NOTE — Progress Notes (Signed)
Urine culture showed >100,000 CFU ESBL E. Coli. Sent e-Rx of Augmentin and Florastor to [facility].

## 2022-04-21 ENCOUNTER — Ambulatory Visit: Payer: Medicare Other | Admitting: Nurse Practitioner

## 2022-06-03 ENCOUNTER — Telehealth: Payer: Self-pay | Admitting: *Deleted

## 2022-06-03 NOTE — Telephone Encounter (Signed)
Stacey Dalton with Stacey Dalton called and left message on Clinical intake and stated that they faxed over paperwork for Stacey Dalton to sign for FL2, Phy Orders and Care Plan.   I do see Paperwork from July but not sure if it was more recent.   Tried calling Stacey Dalton back to see what the date of the paperwork was. LMOM to return call.

## 2022-06-03 NOTE — Telephone Encounter (Signed)
Received paperwork from [facility] with [facility]. Paperwork dated 06/03/2022.   FL2, Physician's orders and care plan.   Placed in [place] folder to review and sign.   To be faxed back to [phone]

## 2022-06-11 ENCOUNTER — Telehealth: Payer: Self-pay

## 2022-06-11 NOTE — Telephone Encounter (Addendum)
Mariah with [place] called stating that the [facility] paperwork that was faxed on 06/03/22 back only had the cover sheet. I refaxed the information that she needed. She refaxed form again stating that FL2 form had been faxed back without provider's signature. The information that was faxed did have provider's signature on all pages except for one. I placed form back on Sherrie Mustache, NP desk to sign one page to be faxed back. Janett Billow will be out of the office until Friday 10/6.

## 2022-06-12 ENCOUNTER — Other Ambulatory Visit: Payer: Self-pay | Admitting: Nurse Practitioner

## 2022-06-12 DIAGNOSIS — G301 Alzheimer's disease with late onset: Secondary | ICD-10-CM

## 2022-07-08 ENCOUNTER — Other Ambulatory Visit: Payer: Self-pay | Admitting: Adult Health

## 2022-07-08 DIAGNOSIS — R4689 Other symptoms and signs involving appearance and behavior: Secondary | ICD-10-CM

## 2022-07-08 NOTE — Telephone Encounter (Signed)
Pharmacy requested refill. Pended Rx and sent to Jessica for approval due to HIGH ALERT Warning.  

## 2022-08-01 ENCOUNTER — Encounter: Payer: Medicare Other | Admitting: Family

## 2022-08-04 ENCOUNTER — Encounter: Payer: Self-pay | Admitting: Family

## 2022-08-04 ENCOUNTER — Ambulatory Visit (INDEPENDENT_AMBULATORY_CARE_PROVIDER_SITE_OTHER): Payer: Medicare Other | Admitting: Family

## 2022-08-04 VITALS — BP 120/87 | HR 82 | Temp 96.3°F | Resp 18 | Ht 64.0 in | Wt 201.4 lb

## 2022-08-04 DIAGNOSIS — R4689 Other symptoms and signs involving appearance and behavior: Secondary | ICD-10-CM

## 2022-08-04 DIAGNOSIS — J069 Acute upper respiratory infection, unspecified: Secondary | ICD-10-CM

## 2022-08-04 LAB — POC COVID19 BINAXNOW: SARS Coronavirus 2 Ag: NEGATIVE

## 2022-08-04 MED ORDER — QUETIAPINE FUMARATE 25 MG PO TABS: ORAL_TABLET | ORAL | 1 refills | Status: DC

## 2022-08-04 MED ORDER — VITAMIN C-ROSE HIPS 500 MG PO TABS: 500.0000 mg | ORAL_TABLET | Freq: Every day | ORAL | 0 refills | Status: AC

## 2022-08-04 MED ORDER — LORATADINE 10 MG PO TABS: 10.0000 mg | ORAL_TABLET | Freq: Every day | ORAL | 0 refills | Status: DC

## 2022-08-04 NOTE — Progress Notes (Signed)
Provider: Melanny Wire FNP-C  Sharon Seller, NP  Patient Care Team: Sharon Seller, NP as PCP - General (Geriatric Medicine) Sharon Seller, NP (Geriatric Medicine)  Extended Emergency Contact Information Primary Emergency Contact: Audery Amel Mobile Phone: [phone] Relation: Daughter Secondary Emergency Contact: Picco,Tom Mobile Phone: [phone] Relation: Son  Code Status:  DNR Goals of care: Advanced Directive information    08/04/2022   10:55 AM  Advanced Directives  Does Patient Have a Medical Advance Directive? Yes  Type of Estate agent of [place];Out of facility DNR (pink MOST or yellow form)  Does patient want to make changes to medical advance directive? No - Patient declined  Copy of Healthcare Power of Attorney in Chart? No - copy requested  Pre-existing out of facility DNR order (yellow form or pink MOST form) Yellow form placed in chart (order not valid for inpatient use)     Chief Complaint  Patient presents with   Acute Visit    Patient complains of ongoing cough.    HPI:  Pt is a 87 y.o. female seen today for an acute visit for evaluation of ongoing cough for several days. She is here with her daughter who provides HPI information patient resides in a assisted living facility.Has the patient's in the facility who has been coughing COVID 19 tests done X 2 was results were negative.She denies any fever,chills,fatigue,body aches,chest tightness,chest pain,palpitation or shortness of breath. Daughter will also want Seroquel half a tablet to be filled full 90-day supply instead of that today's.  Stated patient states Seroquel 25 mg tablet daily at bedtime and also takes Seroquel 12.5 mg tablet daily at 12 noon for agitation.   Past Medical History:  Diagnosis Date   A-fib [facility])    Allergic rhinitis 10/21/2010   Back pain 10/21/2010   Chronic back pain 10/21/2010   Colonic polyp 10/21/2018   Dementia (HCC)     Diabetes mellitus without complication (HCC)    Echocardiogram abnormal 11/09/2018   Epigastric abdominal pain    History of recent travel    Hyperlipidemia    Hypertension    Incontinence    Mixed hyperlipidemia    S/P cholecystectomy 10/21/2010   Past Surgical History:  Procedure Laterality Date   ABDOMINAL HYSTERECTOMY     partial due to prolapse   BACK SURGERY  2007   CATARACT EXTRACTION Left 06/26/2014   CHOLECYSTECTOMY     melanoma resection Right 1980    Allergies  Allergen Reactions   Chamomile Flowers [Chamomile]    Lisinopril    Mixed Ragweed    Other     Seasonal allergies, cats    Outpatient Encounter Medications as of 08/04/2022  Medication Sig   acetaminophen (TYLENOL) 160 MG/5ML liquid Take 30 mLs by mouth as needed for fever.   apixaban (ELIQUIS) 5 MG TABS tablet Take 1 tablet (5 mg total) by mouth 2 (two) times daily.   Ca Phosphate-Cholecalciferol (CALCIUM/VITAMIN D3 GUMMIES) 200-5 MG-MCG CHEW Chew 200 mg by mouth 2 (two) times daily with a meal. Chew one by mouth twice daily.   cyanocobalamin 1000 MCG tablet Take 1,000 mcg by mouth in the morning.   donepezil (ARICEPT) 10 MG tablet TAKE 1 TABLET EVERY EVENING   LORazepam (ATIVAN) 0.5 MG tablet Take one tablet by mouth once daily as needed for anxiety.   losartan (COZAAR) 50 MG tablet TAKE 1 TABLET DAILY   memantine (NAMENDA) 5 MG tablet TAKE 1 TABLET TWICE A DAY   nystatin (  MYCOSTATIN/NYSTOP) powder APPLY 1 APPLICATION TOPICALLY THREE TIMES A DAY   QUEtiapine (SEROQUEL) 25 MG tablet TAKE 1/2 TABLET BY MOUTH DAILY AT NOON   rosuvastatin (CRESTOR) 20 MG tablet TAKE 1 TABLET DAILY   ciprofloxacin (CIPRO) 500 MG tablet Take 1 tablet (500 mg total) by mouth 2 (two) times daily. For 7 days. (Patient not taking: Reported on 08/04/2022)   No facility-administered encounter medications on file as of 08/04/2022.    Review of Systems  Unable to perform ROS: Dementia (HPI information limited provided by  patient's daughter due to patient's cognitive impairment.)  Constitutional:  Negative for appetite change, chills, fatigue, fever and unexpected weight change.  HENT:  Positive for rhinorrhea. Negative for congestion, ear discharge, ear pain, facial swelling, hearing loss, nosebleeds, postnasal drip, sinus pressure, sinus pain, sneezing, sore throat, tinnitus and trouble swallowing.   Eyes:  Negative for pain, discharge, redness, itching and visual disturbance.  Respiratory:  Positive for cough. Negative for chest tightness, shortness of breath and wheezing.   Cardiovascular:  Negative for chest pain, palpitations and leg swelling.  Gastrointestinal:  Negative for abdominal distention, abdominal pain, blood in stool, constipation, diarrhea, nausea and vomiting.  Skin:  Negative for color change, pallor and rash.  Psychiatric/Behavioral:  Negative for behavioral problems, confusion, hallucinations and sleep disturbance. The patient is not nervous/anxious.        Agitation stable on Seroquel    Immunization History  Administered Date(s) Administered   Covid-19, Mrna,Vaccine(Spikevax)52yrs and older 07/08/2022   Influenza, High Dose Seasonal PF 06/28/2012, 08/01/2013, 06/12/2014, 07/23/2015, 07/08/2016, 06/15/2017, 07/12/2019, 06/10/2021, 05/02/2022   Influenza,inj,Quad PF,6+ Mos 06/01/2018   Influenza-Unspecified 07/04/2009, 06/26/2011, 06/11/2020   Moderna Sars-Covid-2 Vaccination 10/04/2019, 10/27/2019, 07/13/2020   PPD Test 03/02/2020   Pfizer Covid-19 Vaccine Bivalent Booster 43yrs & up 05/30/2021   Pneumococcal Conjugate-13 06/12/2014   Pneumococcal Polysaccharide-23 08/15/2000, 09/15/2008   Td 09/15/2004   Tdap 12/09/2019   Zoster Recombinat (Shingrix) 06/16/2011, 07/31/2011   Pertinent  Health Maintenance Due  Topic Date Due   INFLUENZA VACCINE  Completed   DEXA SCAN  Completed      07/22/2021    7:25 AM 10/28/2021    3:57 PM 02/19/2022    3:52 PM 02/25/2022    3:24 PM 08/04/2022    10:55 AM  Fall Risk  Falls in the past year?  1 0 1 1  Was there an injury with Fall?  1 0 1 0  Was there an injury with Fall? - Comments    Yes in November 2022   Fall Risk Category Calculator  3 0 2 1  Fall Risk Category  High Low Moderate Low  Patient Fall Risk Level High fall risk High fall risk Low fall risk Moderate fall risk Low fall risk  Patient at Risk for Falls Due to  History of fall(s) No Fall Risks History of fall(s) History of fall(s)  Fall risk Follow up  Falls evaluation completed Falls evaluation completed Falls evaluation completed Falls evaluation completed   Functional Status Survey:    Vitals:   08/04/22 1053  BP: 120/87  Pulse: 82  Resp: 18  Temp: (!) 96.3 F (35.7 C)  SpO2: 97%  Weight: 201 lb 6 oz (91.3 kg)  Height: 5\' 4"  (1.626 m)   Body mass index is 34.57 kg/m. Physical Exam Vitals reviewed.  Constitutional:      General: She is not in acute distress.    Appearance: Normal appearance. She is obese. She is not ill-appearing or diaphoretic.  HENT:     Head: Normocephalic.     Right Ear: Tympanic membrane, ear canal and external ear normal. There is no impacted cerumen.     Left Ear: Tympanic membrane, ear canal and external ear normal. There is no impacted cerumen.     Nose: Rhinorrhea present. No congestion.     Mouth/Throat:     Mouth: Mucous membranes are moist.     Pharynx: Oropharynx is clear. No oropharyngeal exudate or posterior oropharyngeal erythema.  Eyes:     General: No scleral icterus.       Right eye: No discharge.        Left eye: No discharge.     Extraocular Movements: Extraocular movements intact.     Conjunctiva/sclera: Conjunctivae normal.     Pupils: Pupils are equal, round, and reactive to light.  Neck:     Vascular: No carotid bruit.  Cardiovascular:     Rate and Rhythm: Normal rate and regular rhythm.     Pulses: Normal pulses.     Heart sounds: Normal heart sounds. No murmur heard.    No friction rub. No  gallop.  Pulmonary:     Effort: Pulmonary effort is normal. No respiratory distress.     Breath sounds: Normal breath sounds. No wheezing, rhonchi or rales.  Chest:     Chest wall: No tenderness.  Abdominal:     General: Bowel sounds are normal. There is no distension.     Palpations: Abdomen is soft. There is no mass.     Tenderness: There is no abdominal tenderness. There is no right CVA tenderness, left CVA tenderness, guarding or rebound.  Musculoskeletal:        General: No swelling or tenderness. Normal range of motion.     Cervical back: Normal range of motion. No rigidity or tenderness.     Right lower leg: No edema.     Left lower leg: No edema.  Lymphadenopathy:     Cervical: No cervical adenopathy.  Skin:    General: Skin is warm and dry.     Coloration: Skin is not pale.     Findings: No bruising, erythema, lesion or rash.  Neurological:     Mental Status: She is alert and oriented to person, place, and time.     Cranial Nerves: No cranial nerve deficit.     Sensory: No sensory deficit.     Motor: No weakness.     Coordination: Coordination normal.     Gait: Gait normal.  Psychiatric:        Mood and Affect: Mood normal.        Speech: Speech normal.        Behavior: Behavior normal.        Cognition and Memory: She exhibits impaired recent memory.     Labs reviewed: Recent Labs    10/28/21 1635  NA 142  K 4.2  CL 107  CO2 24  GLUCOSE 91  BUN 21  CREATININE 0.60  CALCIUM 9.5   Recent Labs    10/28/21 1635  AST 16  ALT 15  BILITOT 0.5  PROT 6.8   Recent Labs    10/28/21 1635  WBC 7.5  NEUTROABS 5,070  HGB 13.5  HCT 40.5  MCV 91.4  PLT 225   Lab Results  Component Value Date   TSH 2.173 12/13/2020   Lab Results  Component Value Date   HGBA1C 5.9 10/04/2018   Lab Results  Component Value Date  CHOL 133 10/28/2021   HDL 40 (L) 10/28/2021   LDLCALC 73 10/28/2021   TRIG 115 10/28/2021   CHOLHDL 3.3 10/28/2021    Significant  Diagnostic Results in last 30 days:  No results found.  Assessment/Plan 1. Acute upper respiratory infection Afebrile Rhinorrhea noted Bilateral lung breath sounds clear to auscultation - POC COVID-19 results negative -Recommend to increase fluid intake -Start on loratadine and vitamin C as below - loratadine (CLARITIN) 10 MG tablet; Take 1 tablet (10 mg total) by mouth daily for 14 days.  Dispense: 14 tablet; Refill: 0 - Ascorbic Acid (VITAMIN C WITH ROSE HIPS) 500 MG tablet; Take 1 tablet (500 mg total) by mouth daily for 14 days.  Dispense: 14 tablet; Refill: 0  2. Aggressive behavior Requested refills on Seroquel 12.5 mg tablet daily at noon supply to be 90 instead of 30 days.  Will refill as requested.  Continue to monitor.  Family/ staff Communication: Reviewed plan of care with patient and daughter verbalized understanding  Labs/tests ordered: None   Next Appointment: Return if symptoms worsen or fail to improve.   Caesar Bookman, NP

## 2022-08-08 NOTE — Progress Notes (Signed)
  This encounter was created in error - please disregard. No show 

## 2022-08-18 ENCOUNTER — Other Ambulatory Visit: Payer: Self-pay | Admitting: Nurse Practitioner

## 2022-08-18 DIAGNOSIS — I4821 Permanent atrial fibrillation: Secondary | ICD-10-CM

## 2022-08-21 ENCOUNTER — Ambulatory Visit: Payer: Medicare Other | Admitting: Nurse Practitioner

## 2022-08-28 ENCOUNTER — Ambulatory Visit (INDEPENDENT_AMBULATORY_CARE_PROVIDER_SITE_OTHER): Payer: Medicare Other | Admitting: Family

## 2022-08-28 ENCOUNTER — Encounter: Payer: Self-pay | Admitting: Family

## 2022-08-28 VITALS — BP 128/80 | HR 96 | Temp 98.0°F | Resp 17 | Ht 64.0 in | Wt 201.4 lb

## 2022-08-28 DIAGNOSIS — R600 Localized edema: Secondary | ICD-10-CM

## 2022-08-28 DIAGNOSIS — R2681 Unsteadiness on feet: Secondary | ICD-10-CM | POA: Diagnosis not present

## 2022-08-28 DIAGNOSIS — S81812A Laceration without foreign body, left lower leg, initial encounter: Secondary | ICD-10-CM

## 2022-08-28 NOTE — Progress Notes (Signed)
Provider: Loi Rennaker FNP-C  Sharon Seller, NP  Patient Care Team: Sharon Seller, NP as PCP - General (Geriatric Medicine) Sharon Seller, NP (Geriatric Medicine)  Extended Emergency Contact Information Primary Emergency Contact: Audery Amel Mobile Phone: [phone] Relation: Daughter Secondary Emergency Contact: Stegmaier,Tom Mobile Phone: [phone] Relation: Son  Code Status:  DNR Goals of care: Advanced Directive information    08/28/2022    9:41 AM  Advanced Directives  Does Patient Have a Medical Advance Directive? Yes  Type of Estate agent of [place];Out of facility DNR (pink MOST or yellow form)  Does patient want to make changes to medical advance directive? No - Patient declined  Copy of Healthcare Power of Attorney in Chart? Yes - validated most recent copy scanned in chart (See row information)     Chief Complaint  Patient presents with   Acute Visit    Patient daughter states that patient has skin tear on Left ankle.     HPI:  Pt is a 87 y.o. female seen today for an acute visit for evaluation of left ankle skin tear x 2 days.No fever or chills reported.cause of skin tear unknown.patient has cognitive impairment due to dementia.Daughter states was notified by [facility] Nurse that patient had a skin tear.Daughter has not seen the skin tear since Nurse applied Band Aid.No bleeding or drainage noted.  Has had no fall episode.   Past Medical History:  Diagnosis Date   A-fib [facility])    Allergic rhinitis 10/21/2010   Back pain 10/21/2010   Chronic back pain 10/21/2010   Colonic polyp 10/21/2018   Dementia (HCC)    Diabetes mellitus without complication (HCC)    Echocardiogram abnormal 11/09/2018   Epigastric abdominal pain    History of recent travel    Hyperlipidemia    Hypertension    Incontinence    Mixed hyperlipidemia    S/P cholecystectomy 10/21/2010   Past Surgical History:  Procedure  Laterality Date   ABDOMINAL HYSTERECTOMY     partial due to prolapse   BACK SURGERY  2007   CATARACT EXTRACTION Left 06/26/2014   CHOLECYSTECTOMY     melanoma resection Right 1980    Allergies  Allergen Reactions   Chamomile Flowers [Chamomile]    Lisinopril    Mixed Ragweed    Other     Seasonal allergies, cats    Outpatient Encounter Medications as of 08/28/2022  Medication Sig   acetaminophen (TYLENOL) 160 MG/5ML liquid Take 30 mLs by mouth as needed for fever.   Ca Phosphate-Cholecalciferol (CALCIUM/VITAMIN D3 GUMMIES) 200-5 MG-MCG CHEW Chew 200 mg by mouth 2 (two) times daily with a meal. Chew one by mouth twice daily.   cyanocobalamin 1000 MCG tablet Take 1,000 mcg by mouth in the morning.   donepezil (ARICEPT) 10 MG tablet TAKE 1 TABLET EVERY EVENING   ELIQUIS 5 MG TABS tablet TAKE 1 TABLET TWICE A DAY   loperamide (IMODIUM A-D) 2 MG tablet Take 2 mg by mouth as needed for diarrhea or loose stools.   LORazepam (ATIVAN) 0.5 MG tablet Take one tablet by mouth once daily as needed for anxiety.   losartan (COZAAR) 50 MG tablet TAKE 1 TABLET DAILY   memantine (NAMENDA) 5 MG tablet TAKE 1 TABLET TWICE A DAY   nystatin (MYCOSTATIN/NYSTOP) powder APPLY 1 APPLICATION TOPICALLY THREE TIMES A DAY   nystatin cream (MYCOSTATIN) Apply 1 Application topically 4 (four) times daily as needed for dry skin.   QUEtiapine (SEROQUEL)  25 MG tablet Take 25 mg by mouth at bedtime.   QUEtiapine (SEROQUEL) 25 MG tablet Take 12.5 mg by mouth daily.   rosuvastatin (CRESTOR) 20 MG tablet TAKE 1 TABLET DAILY   [DISCONTINUED] loratadine (CLARITIN) 10 MG tablet Take 1 tablet (10 mg total) by mouth daily for 14 days.   No facility-administered encounter medications on file as of 08/28/2022.    Review of Systems  Unable to perform ROS: Dementia (Additional HPI information provided by patient's daughter)  Constitutional:  Negative for chills, fatigue and fever.  Musculoskeletal:  Positive for  arthralgias and gait problem.  Skin:  Negative for color change, pallor and rash.       Left leg skin tear     Immunization History  Administered Date(s) Administered   Covid-19, Mrna,Vaccine(Spikevax)52yrs and older 07/08/2022   Influenza, High Dose Seasonal PF 06/28/2012, 08/01/2013, 06/12/2014, 07/23/2015, 07/08/2016, 06/15/2017, 07/12/2019, 06/10/2021, 05/02/2022   Influenza,inj,Quad PF,6+ Mos 06/01/2018   Influenza-Unspecified 07/04/2009, 06/26/2011, 06/11/2020   Moderna Sars-Covid-2 Vaccination 10/04/2019, 10/27/2019, 07/13/2020   PPD Test 03/02/2020   Pfizer Covid-19 Vaccine Bivalent Booster 44yrs & up 05/30/2021   Pneumococcal Conjugate-13 06/12/2014   Pneumococcal Polysaccharide-23 08/15/2000, 09/15/2008   Td 09/15/2004   Tdap 12/09/2019   Zoster Recombinat (Shingrix) 06/16/2011, 07/31/2011   Pertinent  Health Maintenance Due  Topic Date Due   INFLUENZA VACCINE  Completed   DEXA SCAN  Completed      10/28/2021    3:57 PM 02/19/2022    3:52 PM 02/25/2022    3:24 PM 08/04/2022   10:55 AM 08/28/2022    9:41 AM  Fall Risk  Falls in the past year? 1 0 1 1 1   Was there an injury with Fall? 1 0 1 0 0  Was there an injury with Fall? - Comments   Yes in November 2022    Fall Risk Category Calculator 3 0 2 1 1   Fall Risk Category High Low Moderate Low Low  Patient Fall Risk Level High fall risk Low fall risk Moderate fall risk Low fall risk Low fall risk  Patient at Risk for Falls Due to History of fall(s) No Fall Risks History of fall(s) History of fall(s) History of fall(s)  Fall risk Follow up Falls evaluation completed Falls evaluation completed Falls evaluation completed Falls evaluation completed Falls evaluation completed;Education provided;Falls prevention discussed   Functional Status Survey:    Vitals:   08/28/22 0937  BP: 128/80  Pulse: 96  Resp: 17  Temp: 98 F (36.7 C)  SpO2: 99%  Weight: 201 lb 6.1 oz (91.3 kg)  Height: 5\' 4"  (1.626 m)   Body mass  index is 34.57 kg/m. Physical Exam Constitutional:      General: She is not in acute distress.    Appearance: She is not ill-appearing.  HENT:     Head: Normocephalic.  Cardiovascular:     Rate and Rhythm: Normal rate and regular rhythm.     Pulses: Normal pulses.     Heart sounds: Normal heart sounds. No murmur heard.    No friction rub. No gallop.  Pulmonary:     Effort: Pulmonary effort is normal. No respiratory distress.     Breath sounds: Normal breath sounds. No wheezing, rhonchi or rales.  Chest:     Chest wall: No tenderness.  Skin:    General: Skin is warm and dry.     Coloration: Skin is not pale.     Findings: No bruising, erythema or rash.  Comments: Left lateral leg skin tear measuring 2.5 cm X 2 cm wound bed red and without any bleeding or drainage.small amounts serous drainage noted on the old band Aid.   Neurological:     Mental Status: She is alert. Mental status is at baseline.     Gait: Gait abnormal.  Psychiatric:        Mood and Affect: Mood normal.        Behavior: Behavior normal.    Labs reviewed: Recent Labs    10/28/21 1635  NA 142  K 4.2  CL 107  CO2 24  GLUCOSE 91  BUN 21  CREATININE 0.60  CALCIUM 9.5   Recent Labs    10/28/21 1635  AST 16  ALT 15  BILITOT 0.5  PROT 6.8   Recent Labs    10/28/21 1635  WBC 7.5  NEUTROABS 5,070  HGB 13.5  HCT 40.5  MCV 91.4  PLT 225   Lab Results  Component Value Date   TSH 2.173 12/13/2020   Lab Results  Component Value Date   HGBA1C 5.9 10/04/2018   Lab Results  Component Value Date   CHOL 133 10/28/2021   HDL 40 (L) 10/28/2021   LDLCALC 73 10/28/2021   TRIG 115 10/28/2021   CHOLHDL 3.3 10/28/2021    Significant Diagnostic Results in last 30 days:  No results found.  Assessment/Plan 1. Unsteady gait Remains high risk for falls - Ambulatory referral to Home Health for Physical Therapy at [facility].  2. Skin tear of left lower leg without complication, initial  encounter Afebrile  Left lateral leg skin tear measuring 2.5 cm X 2 cm wound bed red and without any bleeding or drainage.small amounts serous drainage noted on the old band Aid.  - For home use only DME Other see comment  3. Bilateral lower extremity edema Trace -1+ edema  Encouraged to wear compression stockings on in the morning and off at bedtime.orders written for facility Nurse to measure patient's legs for compression stockings size 20 -30 orders given to daughter to take to the facility. - Compression stockings -Encouraged to keep legs elevated when seated -Reduce salt intake -Monitor weight and notify provider if greater than 3 pounds or any abrupt weight gain  Family/ staff Communication: Reviewed plan of care with patient and daughter verbalized understanding   Labs/tests ordered: None   Next Appointment: Return if symptoms worsen or fail to improve.   Caesar Bookman, NP

## 2022-08-28 NOTE — Patient Instructions (Signed)
-   Apply knee high compression stockings on in the morning and off at bedtime  - Facility Nurse to cleanse left leg skin tear with saline ,pat dry, apply triple antibiotic ointment and covered with foam dressing for extra protection and absorption.change dressing every 3 days and as needed if soiled.

## 2022-08-29 ENCOUNTER — Ambulatory Visit: Payer: Medicare Other | Admitting: Nurse Practitioner

## 2022-09-05 ENCOUNTER — Ambulatory Visit: Payer: Medicare Other | Admitting: Family

## 2022-09-10 ENCOUNTER — Other Ambulatory Visit: Payer: Self-pay | Admitting: Nurse Practitioner

## 2022-09-10 DIAGNOSIS — F02818 Dementia in other diseases classified elsewhere, unspecified severity, with other behavioral disturbance: Secondary | ICD-10-CM

## 2022-09-17 ENCOUNTER — Ambulatory Visit: Payer: Medicare Other | Admitting: Nurse Practitioner

## 2022-09-29 ENCOUNTER — Ambulatory Visit: Payer: Medicare Other | Admitting: Family

## 2022-09-29 ENCOUNTER — Ambulatory Visit: Payer: Medicare Other

## 2022-10-01 ENCOUNTER — Ambulatory Visit (INDEPENDENT_AMBULATORY_CARE_PROVIDER_SITE_OTHER): Payer: Medicare Other | Admitting: Family

## 2022-10-01 ENCOUNTER — Encounter: Payer: Self-pay | Admitting: Family

## 2022-10-01 VITALS — BP 120/72 | HR 86 | Temp 97.5°F | Resp 18 | Ht 64.0 in | Wt 206.0 lb

## 2022-10-01 DIAGNOSIS — M810 Age-related osteoporosis without current pathological fracture: Secondary | ICD-10-CM | POA: Diagnosis not present

## 2022-10-01 DIAGNOSIS — S81812S Laceration without foreign body, left lower leg, sequela: Secondary | ICD-10-CM | POA: Diagnosis not present

## 2022-10-01 DIAGNOSIS — S81812D Laceration without foreign body, left lower leg, subsequent encounter: Secondary | ICD-10-CM | POA: Diagnosis not present

## 2022-10-01 MED ORDER — DENOSUMAB 60 MG/ML ~~LOC~~ SOSY
60.0000 mg | PREFILLED_SYRINGE | Freq: Once | SUBCUTANEOUS | Status: AC
  Administered 2022-10-01: 60 mg via SUBCUTANEOUS

## 2022-10-01 NOTE — Progress Notes (Signed)
Provider: Dayshaun Whobrey FNP-C  Lauree Chandler, NP  Patient Care Team: Lauree Chandler, NP as PCP - General (Geriatric Medicine) Lauree Chandler, NP (Geriatric Medicine)  Extended Emergency Contact Information Primary Emergency Contact: Genevie Cheshire Mobile Phone: [phone] Relation: Daughter Secondary Emergency Contact: Santistevan,Tom Mobile Phone: [phone] Relation: Son  Code Status:  DNR Goals of care: Advanced Directive information    10/01/2022    1:30 PM  Advanced Directives  Does Patient Have a Medical Advance Directive? Yes  Type of Paramedic of [place];Out of facility DNR (pink MOST or yellow form)  Does patient want to make changes to medical advance directive? No - Patient declined  Copy of [place] in Chart? Yes - validated most recent copy scanned in chart (See row information)     Chief Complaint  Patient presents with   Follow-up    Follow up skin tear.    HPI:  Pt is a 88 y.o. female seen today for an acute visit for follow up skin tear on left leg.daughter states has not seen the skin tear but would like provider to see if healing well.No recent fall episode.Also denies any fever,chills or drainage from skin tear site.  She is also here for her 6 months Prolia injection for Osteoporosis.    Past Medical History:  Diagnosis Date   A-fib [facility])    Allergic rhinitis 10/21/2010   Back pain 10/21/2010   Chronic back pain 10/21/2010   Colonic polyp 10/21/2018   Dementia ([place])    Diabetes mellitus without complication ([place])    Echocardiogram abnormal 11/09/2018   Epigastric abdominal pain    History of recent travel    Hyperlipidemia    Hypertension    Incontinence    Mixed hyperlipidemia    S/P cholecystectomy 10/21/2010   Past Surgical History:  Procedure Laterality Date   ABDOMINAL HYSTERECTOMY     partial due to prolapse   BACK SURGERY  2007   CATARACT EXTRACTION Left 06/26/2014    CHOLECYSTECTOMY     melanoma resection Right 1980    Allergies  Allergen Reactions   Chamomile Flowers [Chamomile]    Lisinopril    Mixed Ragweed    Other     Seasonal allergies, cats    Outpatient Encounter Medications as of 10/01/2022  Medication Sig   acetaminophen (TYLENOL) 160 MG/5ML liquid Take 30 mLs by mouth as needed for fever.   Ca Phosphate-Cholecalciferol (CALCIUM/VITAMIN D3 GUMMIES) 200-5 MG-MCG CHEW Chew 200 mg by mouth 2 (two) times daily with a meal. Chew one by mouth twice daily.   cyanocobalamin 1000 MCG tablet Take 1,000 mcg by mouth in the morning.   donepezil (ARICEPT) 10 MG tablet TAKE 1 TABLET EVERY EVENING   ELIQUIS 5 MG TABS tablet TAKE 1 TABLET TWICE A DAY   loperamide (IMODIUM A-D) 2 MG tablet Take 2 mg by mouth as needed for diarrhea or loose stools.   LORazepam (ATIVAN) 0.5 MG tablet Take one tablet by mouth once daily as needed for anxiety.   losartan (COZAAR) 50 MG tablet TAKE 1 TABLET DAILY   memantine (NAMENDA) 5 MG tablet TAKE 1 TABLET TWICE A DAY   nystatin (MYCOSTATIN/NYSTOP) powder APPLY 1 APPLICATION TOPICALLY THREE TIMES A DAY   nystatin cream (MYCOSTATIN) Apply 1 Application topically 4 (four) times daily as needed for dry skin.   QUEtiapine (SEROQUEL) 25 MG tablet Take 12.5 mg by mouth daily.   rosuvastatin (CRESTOR) 20 MG tablet TAKE 1  TABLET DAILY   [DISCONTINUED] QUEtiapine (SEROQUEL) 25 MG tablet Take 25 mg by mouth at bedtime.   No facility-administered encounter medications on file as of 10/01/2022.    Review of Systems  Constitutional:  Negative for appetite change, chills, fatigue, fever and unexpected weight change.  Eyes:  Negative for pain, discharge, redness, itching and visual disturbance.  Respiratory:  Negative for cough, chest tightness, shortness of breath and wheezing.   Cardiovascular:  Negative for chest pain, palpitations and leg swelling.  Gastrointestinal:  Negative for abdominal distention, abdominal pain,  constipation, diarrhea, nausea and vomiting.  Musculoskeletal:  Positive for arthralgias and gait problem. Negative for back pain, joint swelling and myalgias.  Skin:  Negative for color change, pallor, rash and wound.  Neurological:  Negative for dizziness, weakness, light-headedness and headaches.  Hematological:  Does not bruise/bleed easily.  Psychiatric/Behavioral:  Negative for agitation, behavioral problems, confusion, hallucinations and sleep disturbance. The patient is not nervous/anxious.     Immunization History  Administered Date(s) Administered   Covid-19, Mrna,Vaccine(Spikevax)46yrs and older 07/08/2022   Influenza, High Dose Seasonal PF 06/28/2012, 08/01/2013, 06/12/2014, 07/23/2015, 07/08/2016, 06/15/2017, 07/12/2019, 06/10/2021, 05/02/2022   Influenza,inj,Quad PF,6+ Mos 06/01/2018   Influenza-Unspecified 07/04/2009, 06/26/2011, 06/11/2020   Moderna Sars-Covid-2 Vaccination 10/04/2019, 10/27/2019, 07/13/2020   PPD Test 03/02/2020   Pfizer Covid-19 Vaccine Bivalent Booster 76yrs & up 05/30/2021   Pneumococcal Conjugate-13 06/12/2014   Pneumococcal Polysaccharide-23 08/15/2000, 09/15/2008   Td 09/15/2004   Tdap 12/09/2019   Zoster Recombinat (Shingrix) 06/16/2011, 07/31/2011   Pertinent  Health Maintenance Due  Topic Date Due   INFLUENZA VACCINE  Completed   DEXA SCAN  Completed      02/19/2022    3:52 PM 02/25/2022    3:24 PM 08/04/2022   10:55 AM 08/28/2022    9:41 AM 10/01/2022    1:30 PM  Fall Risk  Falls in the past year? 0 1 1 1 1   Was there an injury with Fall? 0 1 0 0 0  Was there an injury with Fall? - Comments  Yes in November 2022     Fall Risk Category Calculator 0 2 1 1 1   Fall Risk Category (Retired) Low Moderate Low Low   (RETIRED) Patient Fall Risk Level Low fall risk Moderate fall risk Low fall risk Low fall risk   Patient at Risk for Falls Due to No Fall Risks History of fall(s) History of fall(s) History of fall(s) History of fall(s)  Fall risk  Follow up Falls evaluation completed Falls evaluation completed Falls evaluation completed Falls evaluation completed;Education provided;Falls prevention discussed Falls evaluation completed;Education provided;Falls prevention discussed   Functional Status Survey:    Vitals:   10/01/22 1339  BP: 120/72  Pulse: 86  Resp: 18  Temp: (!) 97.5 F (36.4 C)  SpO2: 93%  Weight: 206 lb (93.4 kg)  Height: 5\' 4"  (1.626 m)   Body mass index is 35.36 kg/m. Physical Exam Vitals reviewed.  Constitutional:      General: She is not in acute distress.    Appearance: Normal appearance. She is obese. She is not ill-appearing or diaphoretic.  HENT:     Head: Normocephalic.     Right Ear: Tympanic membrane, ear canal and external ear normal. There is no impacted cerumen.     Left Ear: Tympanic membrane, ear canal and external ear normal. There is no impacted cerumen.     Nose: Nose normal. No congestion or rhinorrhea.     Mouth/Throat:     Mouth: Mucous  membranes are moist.     Pharynx: Oropharynx is clear. No oropharyngeal exudate or posterior oropharyngeal erythema.  Eyes:     General: No scleral icterus.       Right eye: No discharge.        Left eye: No discharge.     Extraocular Movements: Extraocular movements intact.     Conjunctiva/sclera: Conjunctivae normal.     Pupils: Pupils are equal, round, and reactive to light.  Neck:     Vascular: No carotid bruit.  Cardiovascular:     Rate and Rhythm: Normal rate and regular rhythm.     Pulses: Normal pulses.     Heart sounds: Normal heart sounds. No murmur heard.    No friction rub. No gallop.  Pulmonary:     Effort: Pulmonary effort is normal. No respiratory distress.     Breath sounds: Normal breath sounds. No wheezing, rhonchi or rales.  Chest:     Chest wall: No tenderness.  Abdominal:     General: Bowel sounds are normal. There is no distension.     Palpations: Abdomen is soft. There is no mass.     Tenderness: There is no  abdominal tenderness. There is no right CVA tenderness, left CVA tenderness, guarding or rebound.  Musculoskeletal:        General: No swelling or tenderness. Normal range of motion.     Cervical back: Normal range of motion. No rigidity or tenderness.     Right lower leg: No edema.     Left lower leg: No edema.  Lymphadenopathy:     Cervical: No cervical adenopathy.  Skin:    General: Skin is warm and dry.     Coloration: Skin is not pale.     Findings: No bruising, erythema, lesion or rash.     Comments: Previous left leg lateral skin tear resolved.skin intact without any erythema.  Neurological:     Mental Status: She is alert. Mental status is at baseline.     Motor: No weakness.     Gait: Gait abnormal.  Psychiatric:        Mood and Affect: Mood normal.        Speech: Speech normal.        Behavior: Behavior normal.        Thought Content: Thought content normal.        Judgment: Judgment normal.    Labs reviewed: Recent Labs    10/28/21 1635  NA 142  K 4.2  CL 107  CO2 24  GLUCOSE 91  BUN 21  CREATININE 0.60  CALCIUM 9.5   Recent Labs    10/28/21 1635  AST 16  ALT 15  BILITOT 0.5  PROT 6.8   Recent Labs    10/28/21 1635  WBC 7.5  NEUTROABS 5,070  HGB 13.5  HCT 40.5  MCV 91.4  PLT 225   Lab Results  Component Value Date   TSH 2.173 12/13/2020   Lab Results  Component Value Date   HGBA1C 5.9 10/04/2018   Lab Results  Component Value Date   CHOL 133 10/28/2021   HDL 40 (L) 10/28/2021   LDLCALC 73 10/28/2021   TRIG 115 10/28/2021   CHOLHDL 3.3 10/28/2021    Significant Diagnostic Results in last 30 days:  No results found.  Assessment/Plan 1. Senile osteoporosis Due for 6 months Prolia injection  - denosumab (PROLIA) injection 60 mg  2. Noninfected skin tear of leg, left, sequela Afebrile  Previous left leg  lateral skin tear resolved.skin intact without any erythema.  Family/ staff Communication: Reviewed plan of care with patient  and daughter verbalized understanding   Labs/tests ordered: None   Next Appointment: Return if symptoms worsen or fail to improve.   Sandrea Hughs, NP

## 2022-10-03 IMAGING — CT CT HEAD W/O CM
3 series · 16 of 47 positions shown, 19 images · non-contrast
Comparison: 12/13/2020

CLINICAL DATA: Unwitnessed fall

EXAM:
CT HEAD WITHOUT CONTRAST
TECHNIQUE: Contiguous axial images were obtained from the base of the skull
through the vertex without intravenous contrast.

[Series 3: head 5.0 h30s · axial · 0.42mm/px · z∈[-109,+31]mm · 10 of 34 slices shown, 13 images]
[im 3/34  brain]
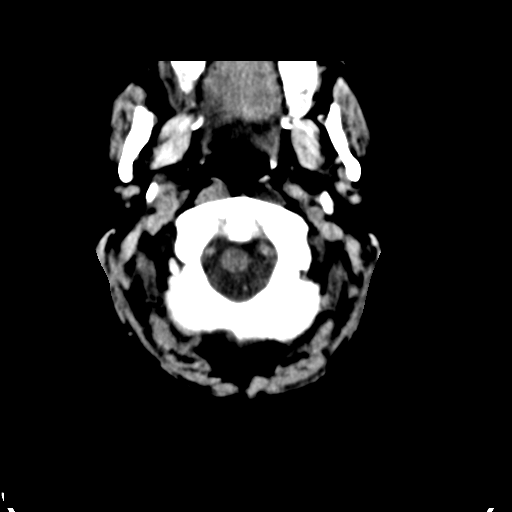
[im 3/34  bone]
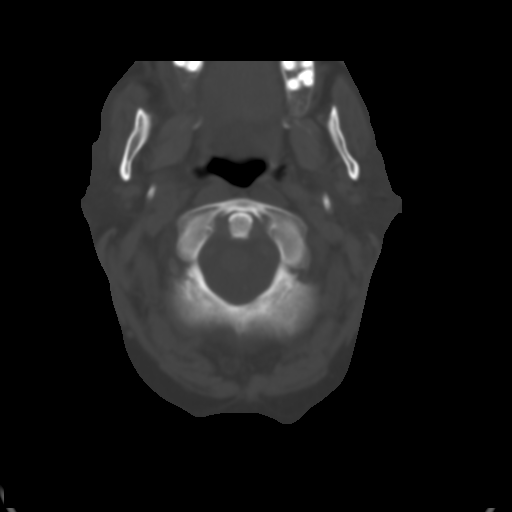
[im 6/34  brain]
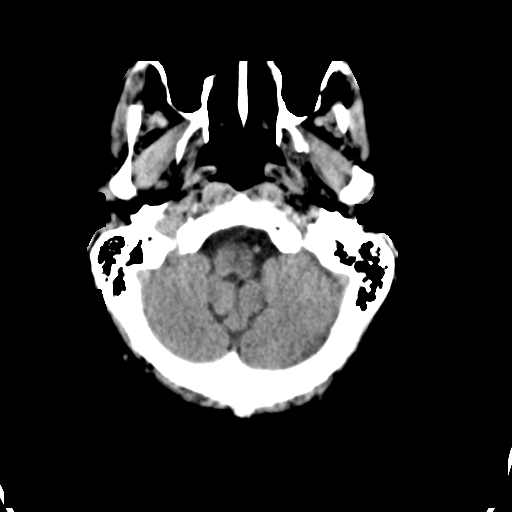
[im 10/34  brain]
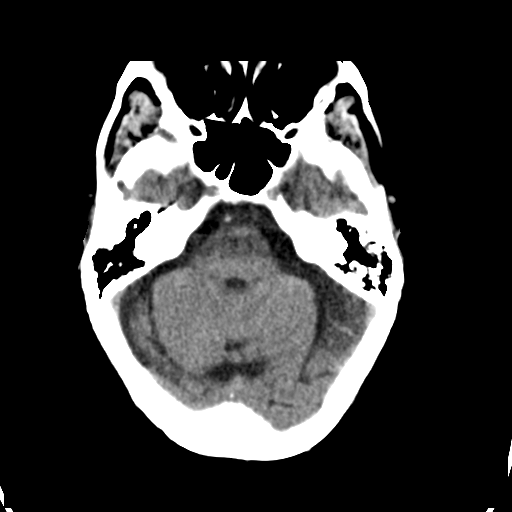
[im 12/34  brain]
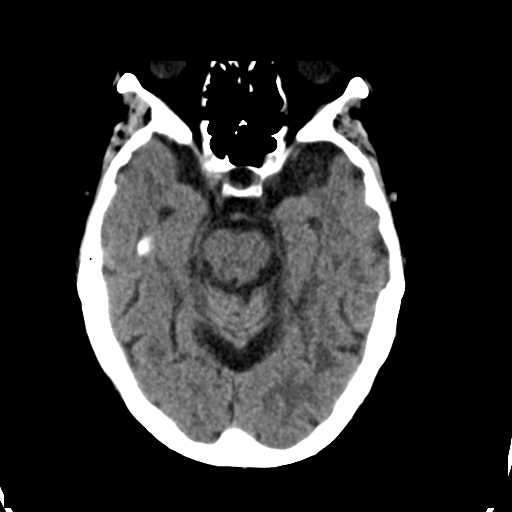
[im 15/34  brain]
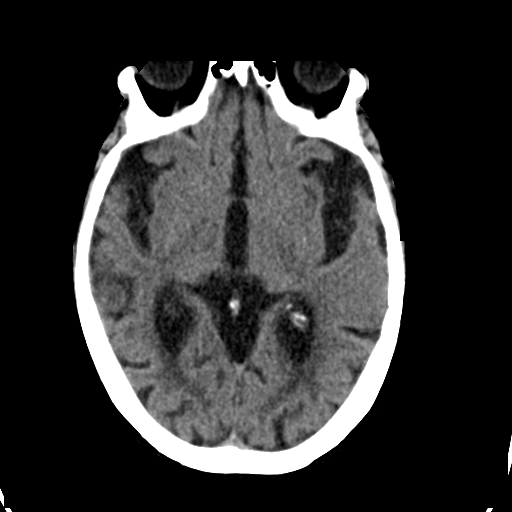
[im 15/34  bone]
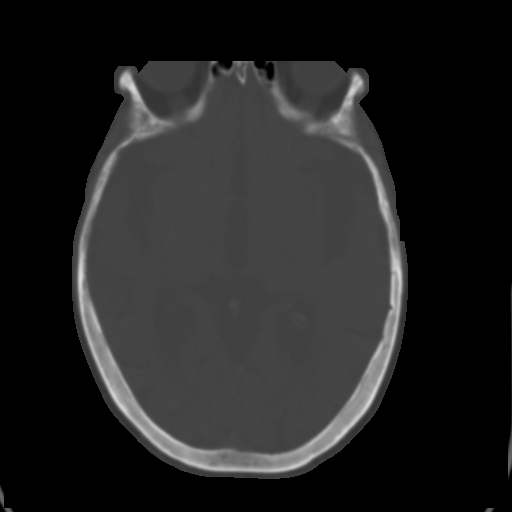
[im 19/34  brain]
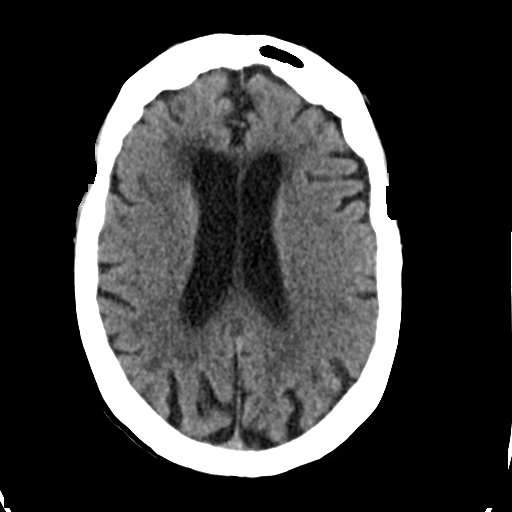
[im 22/34  brain]
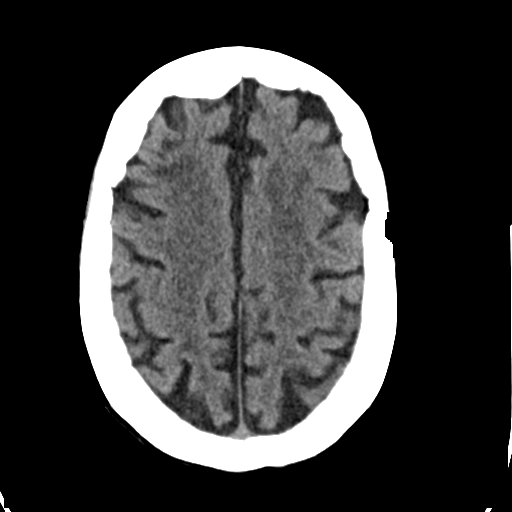
[im 26/34  brain]
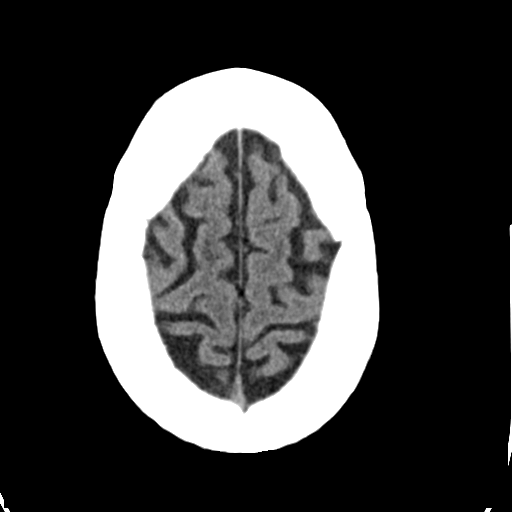
[im 28/34  brain]
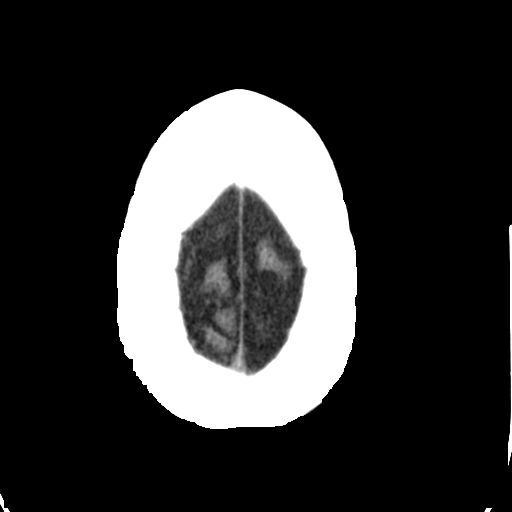
[im 28/34  bone]
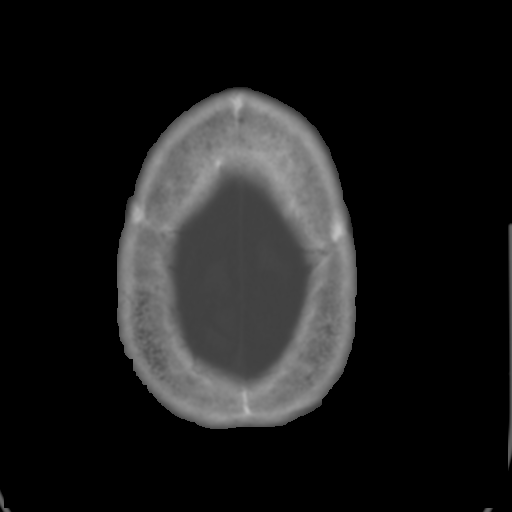
[im 31/34  brain]
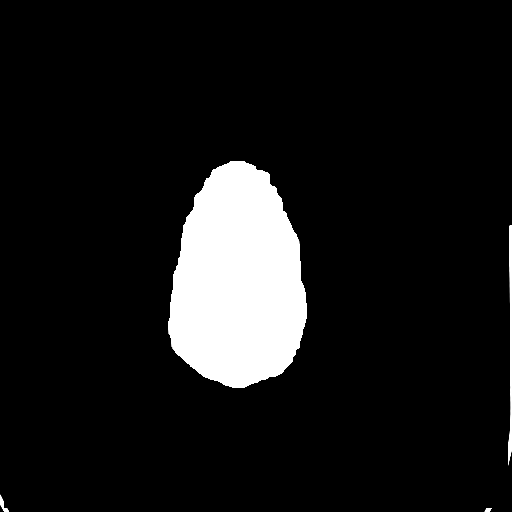

[Series 5: head 3.0 mpr cor · coronal · 0.33mm/px · 3 of 73 slices shown]
[im 25/73  brain]
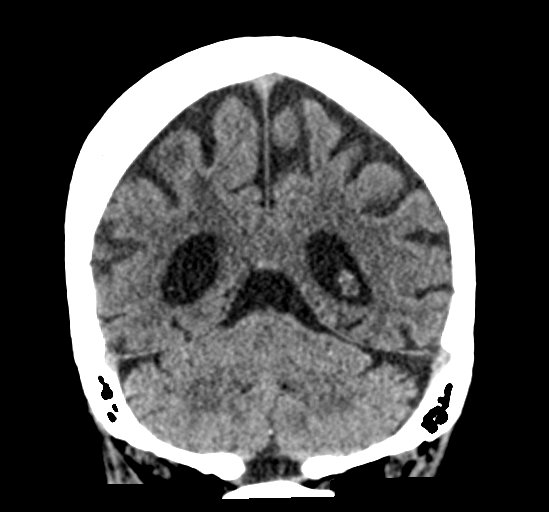
[im 33/73  brain]
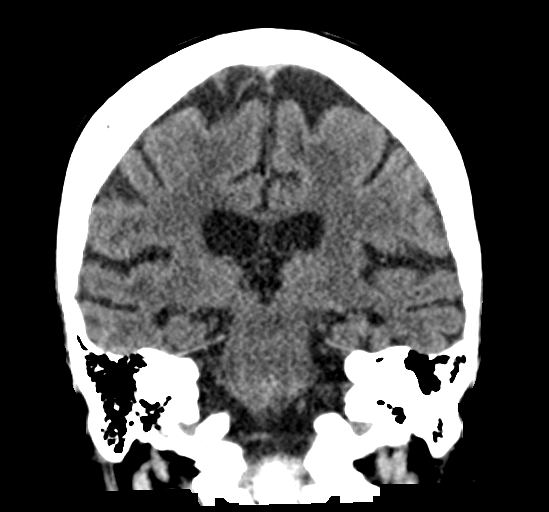
[im 41/73  brain]
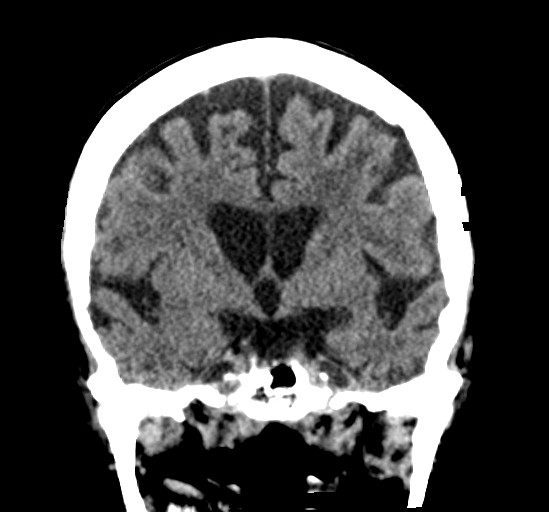

[Series 6: head 3.0 mpr sag · sagittal · 0.35mm/px · 3 of 53 slices shown]
[im 18/53  brain]
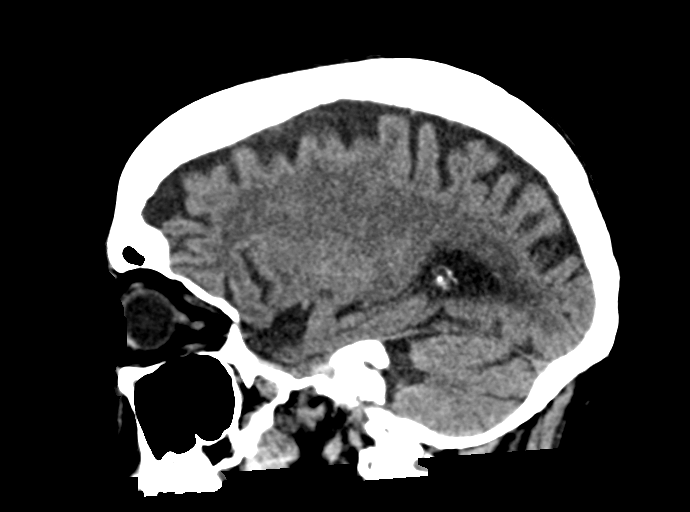
[im 27/53  brain]
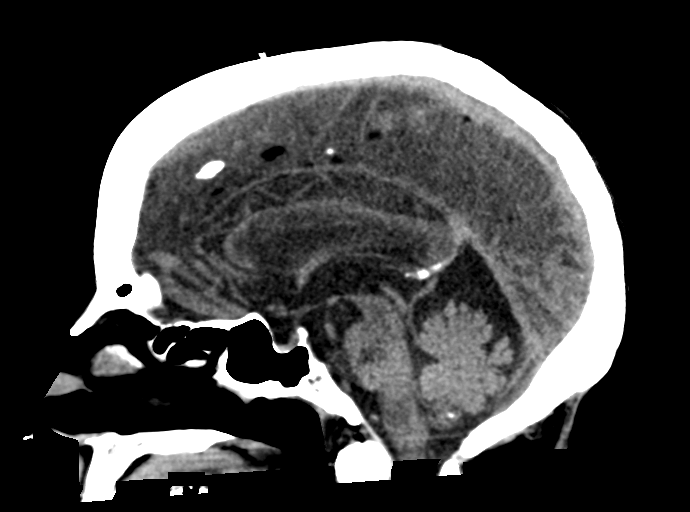
[im 35/53  brain]
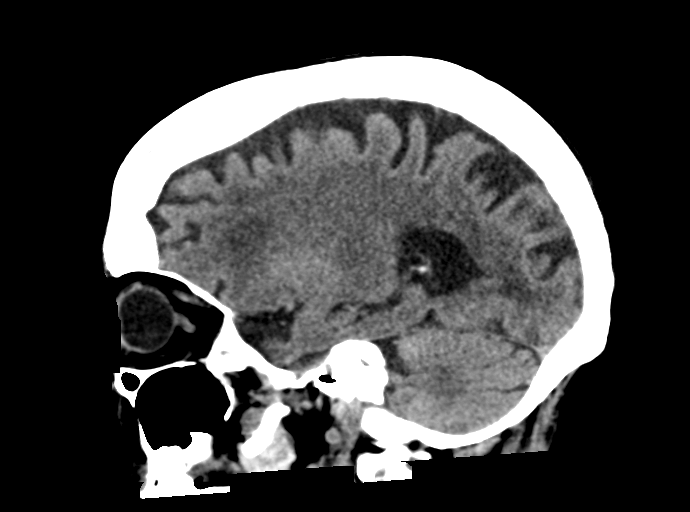

[16 of 47 positions shown; findings below may reference images not displayed]

FINDINGS: Brain: There is atrophy and chronic small vessel disease changes. No
acute intracranial abnormality. Specifically, no hemorrhage,
hydrocephalus, mass lesion, acute infarction, or significant
intracranial injury.

Vascular: No hyperdense vessel or unexpected calcification.

Skull: No acute calvarial abnormality.

Sinuses/Orbits: No acute findings

Other: None
IMPRESSION: Atrophy, chronic microvascular disease.

No acute intracranial abnormality.

## 2022-12-12 ENCOUNTER — Other Ambulatory Visit: Payer: Self-pay | Admitting: Nurse Practitioner

## 2022-12-15 NOTE — Telephone Encounter (Signed)
Request did not correlate with listing on medication list

## 2022-12-22 ENCOUNTER — Other Ambulatory Visit: Payer: Self-pay | Admitting: Nurse Practitioner

## 2023-01-07 ENCOUNTER — Encounter: Payer: Self-pay | Admitting: Nurse Practitioner

## 2023-03-05 ENCOUNTER — Encounter: Payer: Medicare Other | Admitting: Nurse Practitioner

## 2023-03-31 ENCOUNTER — Telehealth: Payer: Medicare Other

## 2023-03-31 NOTE — Telephone Encounter (Signed)
Stacey Dalton with [facility] called requesting last progress note, as they are trying to confirm patients use of prolia. Patient is currently under the care of physician on site at the facility.  Last OV not was sent/routed via Epic to [facility].

## 2023-05-08 ENCOUNTER — Other Ambulatory Visit: Payer: Self-pay | Admitting: Family

## 2023-05-08 DIAGNOSIS — R4689 Other symptoms and signs involving appearance and behavior: Secondary | ICD-10-CM

## 2023-06-16 DEATH — deceased
